# Patient Record
Sex: Female | Born: 1980 | Race: White | Hispanic: No | Marital: Single | Smoking: Never smoker
Health system: Southern US, Community
[De-identification: ages and names within clinical notes are randomized; demographics above are authoritative.]

## PROBLEM LIST (undated history)

## (undated) ENCOUNTER — Emergency Department (HOSPITAL_COMMUNITY): Admission: EM | Source: Home / Self Care

## (undated) DIAGNOSIS — N83209 Unspecified ovarian cyst, unspecified side: Secondary | ICD-10-CM

## (undated) DIAGNOSIS — N200 Calculus of kidney: Secondary | ICD-10-CM

## (undated) DIAGNOSIS — K219 Gastro-esophageal reflux disease without esophagitis: Secondary | ICD-10-CM

## (undated) DIAGNOSIS — F419 Anxiety disorder, unspecified: Secondary | ICD-10-CM

## (undated) DIAGNOSIS — Z9289 Personal history of other medical treatment: Secondary | ICD-10-CM

## (undated) DIAGNOSIS — D649 Anemia, unspecified: Secondary | ICD-10-CM

## (undated) HISTORY — DX: Anxiety disorder, unspecified: F41.9

## (undated) HISTORY — DX: Anemia, unspecified: D64.9

## (undated) HISTORY — DX: Personal history of other medical treatment: Z92.89

## (undated) HISTORY — DX: Gastro-esophageal reflux disease without esophagitis: K21.9

## (undated) HISTORY — DX: Unspecified ovarian cyst, unspecified side: N83.209

---

## 2001-05-13 ENCOUNTER — Emergency Department (HOSPITAL_COMMUNITY): Admission: EM | Admit: 2001-05-13 | Discharge: 2001-05-13 | Payer: Self-pay | Admitting: Emergency Medicine

## 2002-11-19 ENCOUNTER — Ambulatory Visit (HOSPITAL_COMMUNITY): Admission: RE | Admit: 2002-11-19 | Discharge: 2002-11-19 | Payer: Self-pay | Admitting: *Deleted

## 2003-03-30 ENCOUNTER — Inpatient Hospital Stay (HOSPITAL_COMMUNITY): Admission: AD | Admit: 2003-03-30 | Discharge: 2003-04-01 | Payer: Self-pay | Admitting: Obstetrics and Gynecology

## 2003-05-11 ENCOUNTER — Inpatient Hospital Stay (HOSPITAL_COMMUNITY): Admission: AD | Admit: 2003-05-11 | Discharge: 2003-05-11 | Payer: Self-pay | Admitting: Obstetrics and Gynecology

## 2005-08-04 ENCOUNTER — Emergency Department (HOSPITAL_COMMUNITY): Admission: EM | Admit: 2005-08-04 | Discharge: 2005-08-04 | Payer: Self-pay | Admitting: Emergency Medicine

## 2006-11-09 ENCOUNTER — Emergency Department (HOSPITAL_COMMUNITY): Admission: EM | Admit: 2006-11-09 | Discharge: 2006-11-09 | Payer: Self-pay | Admitting: Emergency Medicine

## 2007-06-24 ENCOUNTER — Encounter: Payer: Self-pay | Admitting: Obstetrics & Gynecology

## 2007-06-24 ENCOUNTER — Ambulatory Visit: Payer: Self-pay | Admitting: Obstetrics & Gynecology

## 2007-06-26 ENCOUNTER — Emergency Department (HOSPITAL_COMMUNITY): Admission: EM | Admit: 2007-06-26 | Discharge: 2007-06-26 | Payer: Self-pay | Admitting: Emergency Medicine

## 2007-06-30 ENCOUNTER — Ambulatory Visit (HOSPITAL_COMMUNITY): Admission: RE | Admit: 2007-06-30 | Discharge: 2007-06-30 | Payer: Self-pay | Admitting: Obstetrics and Gynecology

## 2007-07-23 ENCOUNTER — Ambulatory Visit: Payer: Self-pay | Admitting: Family Medicine

## 2007-11-15 ENCOUNTER — Emergency Department (HOSPITAL_COMMUNITY): Admission: EM | Admit: 2007-11-15 | Discharge: 2007-11-15 | Payer: Self-pay | Admitting: Emergency Medicine

## 2007-12-25 ENCOUNTER — Emergency Department (HOSPITAL_BASED_OUTPATIENT_CLINIC_OR_DEPARTMENT_OTHER): Admission: EM | Admit: 2007-12-25 | Discharge: 2007-12-26 | Payer: Self-pay | Admitting: Emergency Medicine

## 2007-12-26 ENCOUNTER — Ambulatory Visit: Payer: Self-pay | Admitting: Diagnostic Radiology

## 2008-08-27 ENCOUNTER — Emergency Department (HOSPITAL_COMMUNITY): Admission: EM | Admit: 2008-08-27 | Discharge: 2008-08-27 | Payer: Self-pay | Admitting: Emergency Medicine

## 2008-10-06 ENCOUNTER — Emergency Department (HOSPITAL_COMMUNITY): Admission: EM | Admit: 2008-10-06 | Discharge: 2008-10-06 | Payer: Self-pay | Admitting: Emergency Medicine

## 2008-10-20 ENCOUNTER — Emergency Department (HOSPITAL_COMMUNITY): Admission: EM | Admit: 2008-10-20 | Discharge: 2008-10-20 | Payer: Self-pay | Admitting: Emergency Medicine

## 2008-10-27 ENCOUNTER — Ambulatory Visit: Payer: Self-pay | Admitting: Obstetrics and Gynecology

## 2008-10-27 ENCOUNTER — Encounter: Payer: Self-pay | Admitting: Obstetrics and Gynecology

## 2008-10-28 ENCOUNTER — Encounter: Payer: Self-pay | Admitting: Obstetrics and Gynecology

## 2008-10-28 LAB — CONVERTED CEMR LAB
HCV Ab: NEGATIVE
Hepatitis B Surface Ag: NEGATIVE
Herpes Simplex Vrs I&II-IgM Ab (EIA): 1.91 — ABNORMAL HIGH
Trich, Wet Prep: NONE SEEN
Yeast Wet Prep HPF POC: NONE SEEN

## 2010-04-27 LAB — URINALYSIS, ROUTINE W REFLEX MICROSCOPIC
Bilirubin Urine: NEGATIVE
Glucose, UA: NEGATIVE mg/dL
Ketones, ur: NEGATIVE mg/dL
Leukocytes, UA: NEGATIVE
Nitrite: NEGATIVE
Protein, ur: NEGATIVE mg/dL
Specific Gravity, Urine: 1.028 (ref 1.005–1.030)
Urobilinogen, UA: 0.2 mg/dL (ref 0.0–1.0)
pH: 5.5 (ref 5.0–8.0)

## 2010-04-27 LAB — URINE MICROSCOPIC-ADD ON

## 2010-04-27 LAB — PREGNANCY, URINE: Preg Test, Ur: NEGATIVE

## 2010-05-22 ENCOUNTER — Emergency Department (HOSPITAL_COMMUNITY)

## 2010-05-22 ENCOUNTER — Emergency Department (HOSPITAL_COMMUNITY)
Admission: EM | Admit: 2010-05-22 | Discharge: 2010-05-22 | Disposition: A | Discharge status: Home / Self Care | Attending: Emergency Medicine | Admitting: Emergency Medicine

## 2010-05-22 DIAGNOSIS — R110 Nausea: Secondary | ICD-10-CM | POA: Insufficient documentation

## 2010-05-22 DIAGNOSIS — Z87442 Personal history of urinary calculi: Secondary | ICD-10-CM | POA: Insufficient documentation

## 2010-05-22 DIAGNOSIS — R109 Unspecified abdominal pain: Secondary | ICD-10-CM | POA: Insufficient documentation

## 2010-05-22 LAB — DIFFERENTIAL
Basophils Absolute: 0 10*3/uL (ref 0.0–0.1)
Basophils Relative: 0 % (ref 0–1)
Eosinophils Absolute: 0.2 10*3/uL (ref 0.0–0.7)
Eosinophils Relative: 3 % (ref 0–5)
Lymphocytes Relative: 26 % (ref 12–46)
Lymphs Abs: 1.8 10*3/uL (ref 0.7–4.0)
Monocytes Absolute: 0.7 10*3/uL (ref 0.1–1.0)
Monocytes Relative: 10 % (ref 3–12)
Neutro Abs: 4.2 10*3/uL (ref 1.7–7.7)
Neutrophils Relative %: 60 % (ref 43–77)

## 2010-05-22 LAB — URINALYSIS, ROUTINE W REFLEX MICROSCOPIC
Bilirubin Urine: NEGATIVE
Glucose, UA: NEGATIVE mg/dL
Hgb urine dipstick: NEGATIVE
Ketones, ur: NEGATIVE mg/dL
Nitrite: NEGATIVE
Protein, ur: NEGATIVE mg/dL
Specific Gravity, Urine: 1.021 (ref 1.005–1.030)
Urobilinogen, UA: 1 mg/dL (ref 0.0–1.0)
pH: 6.5 (ref 5.0–8.0)

## 2010-05-22 LAB — POCT I-STAT, CHEM 8
BUN: 6 mg/dL (ref 6–23)
Calcium, Ion: 1.09 mmol/L — ABNORMAL LOW (ref 1.12–1.32)
Chloride: 103 mEq/L (ref 96–112)
Creatinine, Ser: 0.7 mg/dL (ref 0.4–1.2)
Glucose, Bld: 90 mg/dL (ref 70–99)
HCT: 39 % (ref 36.0–46.0)
Hemoglobin: 13.3 g/dL (ref 12.0–15.0)
Potassium: 3.5 mEq/L (ref 3.5–5.1)
Sodium: 138 mEq/L (ref 135–145)
TCO2: 24 mmol/L (ref 0–100)

## 2010-05-22 LAB — CBC
HCT: 37.8 % (ref 36.0–46.0)
Hemoglobin: 13 g/dL (ref 12.0–15.0)
MCH: 28.8 pg (ref 26.0–34.0)
MCHC: 34.4 g/dL (ref 30.0–36.0)
MCV: 83.6 fL (ref 78.0–100.0)
Platelets: 248 10*3/uL (ref 150–400)
RBC: 4.52 MIL/uL (ref 3.87–5.11)
RDW: 12.8 % (ref 11.5–15.5)
WBC: 6.9 10*3/uL (ref 4.0–10.5)

## 2010-05-22 LAB — POCT PREGNANCY, URINE: Preg Test, Ur: NEGATIVE

## 2010-06-05 NOTE — Group Therapy Note (Signed)
NAMEKOYA, HUNGER NO.:  [id number]   MEDICAL RECORD NO.:  [id number]          PATIENT TYPE:  WOC   LOCATION:  [facility]                   FACILITY:  [facility]   PHYSICIAN:  Tinnie Gens, MD        DATE OF BIRTH:  October 19, 1980   DATE OF SERVICE:  07/23/2007                                  CLINIC NOTE   CHIEF COMPLAINT:  Followup.   HISTORY OF PRESENT ILLNESS:  The patient is a 30 year old gravida 2 para  1-0-1-1 who was seen by Dr. Nicholaus Bloom on June 24, 2007.  She was  complaining of some dyspareunia and infertility.  The patient has a  history of a left ovarian cyst so she ordered a TSH, had a Pap smear and  an ultrasound.  She is here today to follow up these results.  Patient  has a husband who is in [employer], they have been having unprotected  intercourse for the last two years, have not gotten pregnant, but my  guess is that is because he is only home for a day or two at a time.  He  is the father of her other child and she did not have any trouble  getting pregnant with that one.  As I am questioning her though she  breaks into a crying spell today.  I am giving her results that include  a normal pelvic sonogram, normal TSH and normal Pap smear.  She wants to  know why she is gaining so much weight.  She reports that she cries  daily, she is hypersomnolent, that she finds life a little too stressful  her.  She did get to the ER with panic attacks recently, they put her on  Flexeril.  Patient just feels overwhelmed and alone.  She is originally  from [place] but she has no family here.  Her husband is away most of the  time, he has a year left on his commitment to the [employer].  She is trying  to finish school and working all the time.   EXAMINATION:  Her vitals are as noted in the chart.  Her blood pressure  is 140/86 today which is mildly elevated, her weight is 207.   IMPRESSION:  1. Depression and anxiety disorder with questionably panic attacks as       well.  2. Normal pelvic sonogram.  3. Normal thyroid stimulating hormone.   PLAN:  1. Discussed with her at length medicating herself for depression and      anxiety, how long it would take for medication to work and why I      felt like it was necessary.  2. Also reviewed fertility and issues of when was a god time to      achieve pregnancy.  Patient had not really understood this, nor did      she realize just how infrequently she and her husband were actually      having sex for the past two years.  3. She will follow up in two to three months to review this.  ______________________________  Tinnie Gens, MD    TP/MEDQ  D:  07/23/2007  T:  07/23/2007  Job:  [id number]

## 2010-06-05 NOTE — Group Therapy Note (Signed)
NAMEZARRIA, TOWELL NO.:  [id number]   MEDICAL RECORD NO.:  [id number]          PATIENT TYPE:  WOC   LOCATION:  [facility]                   [facility]ACILITY:  [facility]   PHYSICIAN:  Allie Bossier, MD        DATE OF BIRTH:  April 22, 1980   DATE OF SERVICE:                                  CLINIC NOTE   Misty Simpson is a 30 year old married, [place]ian, gravida 2, para 1, and abortus  62 with a 25-year-old son.  She comes in here today for her annual exam.  She complains of some UTI symptoms today.  Urinalysis done today is  normal.  She also complains of dyspareunia since being married to her  husband.  She does give a history of being told that she had an ovarian  cyst seen on a CT scan done in 2008.  She has not had followup for this  since.   PAST MEDICAL HISTORY:  1. Obesity.  2. History of kidney stones in 2008.  3. History of left ovarian cyst in 2008.  4. Dyspareunia since 2008.   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  She  has been married for the last year.  She is wanting a pregnancy and has  been having unprotected intercourse for the last 2 years.  She reports  monthly periods that last for 5 days each.   PREVIOUS PROCEDURE:  She had an elective AB done with Cytotec in the  past.   FAMILY HISTORY:  Negative for breast, GYN, and colon malignancy.  It is  positive for skin cancer in her mother and coronary artery disease/MI in  her father.   SOCIAL HISTORY:  She drinks about 4 alcoholic beverages per week.  Pap  smear was last done in 2006 and that was normal per the patient.   PHYSICAL EXAMINATION:  Weight 207, height 5 feet 5 inch, blood pressure  147/79, and pulse 79.  HEENT:  Normal.  BREAST:  Normal bilaterally.  HEART:  Regular rate and rhythm.  LUNGS:  Clear to auscultation bilaterally.  ABDOMEN:  Benign.  External genitalia, shaved, and no lesions.  Cervix, normal ovulatory  discharge (tenacious clear mucus).  Cervix is without  lesions.  Her  uterus is normal size and shape, midplane, and nontender.  Adnexa are  difficult to palpate due to her body habitus and some voluntary  guarding.   ASSESSMENT/PLAN:  1. Annual exam.  I have checked a Pap smear along with cervical      cultures.  Recommended self-breast exams monthly.  2. Dyspareunia and history of left ovarian cyst.  I will order an      ultrasound to follow up on this.  3. Desires pregnancy.  I have recommended that she begin prenatal      vitamins and folic acid to help prevent birth defects.  I have also      checked a TSH since she has not got pregnant in 2 years.      Allie Bossier, MD     MCD/MEDQ  D:  06/24/2007  T:  06/25/2007  Job:  [id number]
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
## 2010-10-18 LAB — POCT URINALYSIS DIP (DEVICE)
Bilirubin Urine: NEGATIVE
Hgb urine dipstick: NEGATIVE
Ketones, ur: NEGATIVE
Specific Gravity, Urine: 1.015
pH: 7

## 2010-10-22 LAB — COMPREHENSIVE METABOLIC PANEL
ALT: 21
Albumin: 4
Calcium: 9
Glucose, Bld: 99
Sodium: 137
Total Protein: 7

## 2010-10-22 LAB — DIFFERENTIAL
Eosinophils Absolute: 0.2
Lymphs Abs: 1.7
Monocytes Absolute: 0.6
Monocytes Relative: 6
Neutro Abs: 8.2 — ABNORMAL HIGH
Neutrophils Relative %: 77

## 2010-10-22 LAB — URINALYSIS, ROUTINE W REFLEX MICROSCOPIC
Bilirubin Urine: NEGATIVE
Glucose, UA: NEGATIVE
Hgb urine dipstick: NEGATIVE
Nitrite: NEGATIVE
Specific Gravity, Urine: 1.023
pH: 7

## 2010-10-22 LAB — CBC
Hemoglobin: 13.8
MCV: 85.5
RBC: 4.7
WBC: 10.7 — ABNORMAL HIGH

## 2011-06-03 ENCOUNTER — Encounter (HOSPITAL_COMMUNITY): Payer: Self-pay | Admitting: Emergency Medicine

## 2011-06-03 ENCOUNTER — Emergency Department (HOSPITAL_COMMUNITY)
Admission: EM | Admit: 2011-06-03 | Discharge: 2011-06-03 | Disposition: A | Discharge status: Home / Self Care | Payer: Managed Care, Other (non HMO) | Attending: Emergency Medicine | Admitting: Emergency Medicine

## 2011-06-03 ENCOUNTER — Emergency Department (HOSPITAL_COMMUNITY): Payer: Managed Care, Other (non HMO)

## 2011-06-03 DIAGNOSIS — R110 Nausea: Secondary | ICD-10-CM | POA: Insufficient documentation

## 2011-06-03 DIAGNOSIS — N898 Other specified noninflammatory disorders of vagina: Secondary | ICD-10-CM | POA: Insufficient documentation

## 2011-06-03 DIAGNOSIS — R109 Unspecified abdominal pain: Secondary | ICD-10-CM | POA: Insufficient documentation

## 2011-06-03 HISTORY — DX: Calculus of kidney: N20.0

## 2011-06-03 LAB — URINALYSIS, ROUTINE W REFLEX MICROSCOPIC
Glucose, UA: NEGATIVE mg/dL
Ketones, ur: NEGATIVE mg/dL
Nitrite: NEGATIVE
Protein, ur: NEGATIVE mg/dL
Urobilinogen, UA: 0.2 mg/dL (ref 0.0–1.0)

## 2011-06-03 LAB — LIPASE, BLOOD: Lipase: 17 U/L (ref 11–59)

## 2011-06-03 LAB — URINE MICROSCOPIC-ADD ON

## 2011-06-03 LAB — CBC
HCT: 37.2 % (ref 36.0–46.0)
MCH: 29.6 pg (ref 26.0–34.0)
MCV: 84.7 fL (ref 78.0–100.0)
RBC: 4.39 MIL/uL (ref 3.87–5.11)
WBC: 8.7 10*3/uL (ref 4.0–10.5)

## 2011-06-03 LAB — COMPREHENSIVE METABOLIC PANEL
BUN: 6 mg/dL (ref 6–23)
CO2: 25 mEq/L (ref 19–32)
Chloride: 100 mEq/L (ref 96–112)
Creatinine, Ser: 0.65 mg/dL (ref 0.50–1.10)
GFR calc Af Amer: >90 mL/min (ref >90)
GFR calc non Af Amer: >90 mL/min (ref >90)
Total Bilirubin: 0.6 mg/dL (ref 0.3–1.2)

## 2011-06-03 MED ORDER — KETOROLAC TROMETHAMINE 30 MG/ML IJ SOLN
30.0000 mg | Freq: Once | INTRAMUSCULAR | Status: AC
  Administered 2011-06-03: 30 mg via INTRAVENOUS
  Filled 2011-06-03: qty 1

## 2011-06-03 MED ORDER — ONDANSETRON HCL 4 MG/2ML IJ SOLN
4.0000 mg | Freq: Once | INTRAMUSCULAR | Status: AC
  Administered 2011-06-03: 4 mg via INTRAVENOUS
  Filled 2011-06-03: qty 2

## 2011-06-03 MED ORDER — SODIUM CHLORIDE 0.9 % IV BOLUS (SEPSIS)
1000.0000 mL | Freq: Once | INTRAVENOUS | Status: AC
  Administered 2011-06-03: 1000 mL via INTRAVENOUS

## 2011-06-03 MED ORDER — PANTOPRAZOLE SODIUM 40 MG PO TBEC: 40.0000 mg | DELAYED_RELEASE_TABLET | Freq: Every day | ORAL | Status: DC

## 2011-06-03 MED ORDER — ONDANSETRON HCL 8 MG PO TABS: 8.0000 mg | ORAL_TABLET | Freq: Three times a day (TID) | ORAL | Status: AC | PRN

## 2011-06-03 NOTE — ED Provider Notes (Signed)
History     CSN: [id number]  Arrival date & time 06/03/11  4098   First MD Initiated Contact with Patient 06/03/11 [phone]      Chief Complaint  Patient presents with  . Abdominal Pain  . Nausea  . Vaginal Discharge    (Consider location/radiation/quality/duration/timing/severity/associated sxs/prior treatment) The history is provided by the patient.   Pt c/o epigastric/ruq pain after eating jalapenos, etoh and chicken 2 nights ago. ?single episode emesis. Pain crampy, dull comes and goes. No back or flank pain. No gu c/o. No lower abd pain. No fever or chills.  Pt denies ill contacts. No hx pud, pancreatitis, or gallstones. No cough or uri c/o. No chest pain. Is having normal bms, no diarrhea or constipation. lnmp 2 weeks ago - normal.     Past Medical History  Diagnosis Date  . Kidney stones     History reviewed. No pertinent past surgical history.  History reviewed. No pertinent family history.  History  Substance Use Topics  . Smoking status: Never Smoker   . Smokeless tobacco: Not on file  . Alcohol Use: Yes     weekends    OB History    Grav Para Term Preterm Abortions TAB SAB Ect Mult Living                  Review of Systems  Constitutional: Negative for fever and chills.  HENT: Negative for neck pain.   Eyes: Negative for redness.  Respiratory: Negative for cough and shortness of breath.   Cardiovascular: Negative for chest pain.  Gastrointestinal: Positive for abdominal pain.  Genitourinary: Negative for dysuria, hematuria and flank pain.  Musculoskeletal: Negative for back pain.  Skin: Negative for rash.  Neurological: Negative for headaches.  Hematological: Does not bruise/bleed easily.  Psychiatric/Behavioral: Negative for confusion.    Allergies  Review of patient's allergies indicates no known allergies.  Home Medications  No current outpatient prescriptions on file.  BP 138/81  Pulse 65  Temp(Src) 98 F (36.7 C) (Oral)  Resp 16   SpO2 100%  LMP 05/20/2011  Physical Exam  Nursing note and vitals reviewed. Constitutional: She appears well-developed and well-nourished. No distress.  HENT:  Mouth/Throat: Oropharynx is clear and moist.  Eyes: Conjunctivae are normal. No scleral icterus.  Neck: Neck supple. No tracheal deviation present.  Cardiovascular: Normal rate, regular rhythm, normal heart sounds and intact distal pulses.  Exam reveals no gallop and no friction rub.   No murmur heard. Pulmonary/Chest: Effort normal and breath sounds normal. No respiratory distress.  Abdominal: Soft. Normal appearance and bowel sounds are normal. She exhibits no distension and no mass. There is no rebound and no guarding.       Epigastric/upper abd tenderness no rebound or guarding. No hernia.   Genitourinary:       No cva tenderness  Musculoskeletal: She exhibits no edema.  Neurological: She is alert.  Skin: Skin is warm and dry. No rash noted.  Psychiatric: She has a normal mood and affect.    ED Course  Procedures (including critical care time)  Labs Reviewed  COMPREHENSIVE METABOLIC PANEL - Abnormal; Notable for the following:    Glucose, Bld 102 (*)    All other components within normal limits  URINALYSIS, ROUTINE W REFLEX MICROSCOPIC - Abnormal; Notable for the following:    APPearance CLOUDY (*)    Leukocytes, UA SMALL (*)    All other components within normal limits  URINE MICROSCOPIC-ADD ON - Abnormal; Notable for the  following:    Squamous Epithelial / LPF FEW (*)    Bacteria, UA FEW (*)    All other components within normal limits  CBC  LIPASE, BLOOD  PREGNANCY, URINE   US Abdomen Complete  06/03/2011  *RADIOLOGY REPORT*  Clinical Data:  Abdominal pain.  COMPLETE ABDOMINAL ULTRASOUND  Comparison:  CT 05/22/2010.  Findings:  Gallbladder:  No gallstones, gallbladder wall thickening, or pericholecystic fluid.  Common bile duct:   Normal caliber, 5 mm.  Liver:  No focal lesion identified.  Within normal limits  in parenchymal echogenicity.  IVC:  Appears normal.  Pancreas:  No focal abnormality seen.  Spleen:  Within normal limits in size and echotexture.  Right Kidney:   Normal in size and parenchymal echogenicity.  No evidence of mass or hydronephrosis.  Left Kidney:  Normal in size and parenchymal echogenicity.  No evidence of mass or hydronephrosis.  Abdominal aorta:  No aneurysm identified.  IMPRESSION: Negative abdominal ultrasound.  Original Report Authenticated By: Cyndie Chime, M.D.       MDM  Iv ns bolus. zofran iv. Labs.  U/s as epigastric and upper abd pain. \  U/s negative. toradol iv (pt drove self to ed).   Trial po fluids. Recheck abd soft nt.   Tolerating po. abd soft nt.      Suzi Roots, MD 06/03/11 1250

## 2011-06-03 NOTE — ED Notes (Addendum)
Epigastric pain that started at 6 pm yesterday, after eating jalepenos, fried chicken, etoh use saturday. Has had pain like this 2 months ago after greasy and spicy foods and drinking etoh. No vomiting, but nauseated,  Also c/o vag d/c- started yesterday--

## 2011-06-03 NOTE — Discharge Instructions (Signed)
The ultrasound was read as being negative/normal. Rest. Drink plenty of fluids. Take protonix (antacid medication). You may also try maalox as need.  You may take zofran as need for nausea. Follow up with primary care doctor, or here, in next 1-2 days for recheck if symptoms fail to resolve. Return to ER right away if worse, severe pain, persistent vomiting, high fevers, other concern.      Abdominal Pain Abdominal pain can be caused by many things. Your caregiver decides the seriousness of your pain by an examination and possibly blood tests and X-rays. Many cases can be observed and treated at home. Most abdominal pain is not caused by a disease and will probably improve without treatment. However, in many cases, more time must pass before a clear cause of the pain can be found. Before that point, it may not be known if you need more testing, or if hospitalization or surgery is needed. HOME CARE INSTRUCTIONS   Do not take laxatives unless directed by your caregiver.   Take pain medicine only as directed by your caregiver.   Only take over-the-counter or prescription medicines for pain, discomfort, or fever as directed by your caregiver.   Try a clear liquid diet (broth, tea, or water) for as long as directed by your caregiver. Slowly move to a bland diet as tolerated.  SEEK IMMEDIATE MEDICAL CARE IF:   The pain does not go away.   You have a fever.   You keep throwing up (vomiting).   The pain is felt only in portions of the abdomen. Pain in the right side could possibly be appendicitis. In an adult, pain in the left lower portion of the abdomen could be colitis or diverticulitis.   You pass bloody or black tarry stools.  MAKE SURE YOU:   Understand these instructions.   Will watch your condition.   Will get help right away if you are not doing well or get worse.  Document Released: 10/17/2004 Document Revised: 12/27/2010 Document Reviewed: 08/26/2007 [facility] Patient  Information 2012 [place], [place].      Gastritis Gastritis is an inflammation (the body's way of reacting to injury and/or infection) of the stomach. It is often caused by viral or bacterial (germ) infections. It can also be caused by chemicals (including alcohol) and medications. This illness may be associated with generalized malaise (feeling tired, not well), cramps, and fever. The illness may last 2 to 7 days. If symptoms of gastritis continue, gastroscopy (looking into the stomach with a telescope-like instrument), biopsy (taking tissue samples), and/or blood tests may be necessary to determine the cause. Antibiotics will not affect the illness unless there is a bacterial infection present. One common bacterial cause of gastritis is an organism known as H. Pylori. This can be treated with antibiotics. Other forms of gastritis are caused by too much acid in the stomach. They can be treated with medications such as H2 blockers and antacids. Home treatment is usually all that is needed. Young children will quickly become dehydrated (loss of body fluids) if vomiting and diarrhea are both present. Medications may be given to control nausea. Medications are usually not given for diarrhea unless especially bothersome. Some medications slow the removal of the virus from the gastrointestinal tract. This slows down the healing process. HOME CARE INSTRUCTIONS Home care instructions for nausea and vomiting:  For adults: drink small amounts of fluids often. Drink at least 2 quarts a day. Take sips frequently. Do not drink large amounts of fluid at  one time. This may worsen the nausea.   Only take over-the-counter or prescription medicines for pain, discomfort, or fever as directed by your caregiver.   Drink clear liquids only. Those are anything you can see through such as water, broth, or soft drinks.   Once you are keeping clear liquids down, you may start full liquids, soups, juices, and ice cream or  sherbet. Slowly add bland (plain, not spicy) foods to your diet.  Home care instructions for diarrhea:  Diarrhea can be caused by bacterial infections or a virus. Your condition should improve with time, rest, fluids, and/or anti-diarrheal medication.   Until your diarrhea is under control, you should drink clear liquids often in small amounts. Clear liquids include: water, broth, jell-o water and weak tea.  Avoid:  Milk.   Fruits.   Tobacco.   Alcohol.   Extremely hot or cold fluids.   Too much intake of anything at one time.  When your diarrhea stops you may add the following foods, which help the stool to become more formed:  Rice.   Bananas.   Apples without skin.   Dry toast.  Once these foods are tolerated you may add low-fat yogurt and low-fat cottage cheese. They will help to restore the normal bacterial balance in your bowel. Wash your hands well to avoid spreading bacteria (germ) or virus. SEEK IMMEDIATE MEDICAL CARE IF:   You are unable to keep fluids down.   Vomiting or diarrhea become persistent (constant).   Abdominal pain develops, increases, or localizes. (Right sided pain can be appendicitis. Left sided pain in adults can be diverticulitis.)   You develop a fever (an oral temperature above 102 F (38.9 C)).   Diarrhea becomes excessive or contains blood or mucus.   You have excessive weakness, dizziness, fainting or extreme thirst.   You are not improving or you are getting worse.   You have any other questions or concerns.  Document Released: 01/01/2001 Document Revised: 12/27/2010 Document Reviewed: 01/07/2005 [facility] Patient Information 2012 [place], [place].

## 2011-06-03 NOTE — ED Notes (Signed)
Tolerated POs well.

## 2011-06-27 ENCOUNTER — Other Ambulatory Visit: Payer: Self-pay | Admitting: Physician Assistant

## 2011-06-27 ENCOUNTER — Other Ambulatory Visit (HOSPITAL_COMMUNITY)
Admission: RE | Admit: 2011-06-27 | Discharge: 2011-06-27 | Disposition: A | Discharge status: Home / Self Care | Payer: Managed Care, Other (non HMO) | Source: Ambulatory Visit | Attending: Family Medicine | Admitting: Family Medicine

## 2011-06-27 DIAGNOSIS — Z0000 Encounter for general adult medical examination without abnormal findings: Secondary | ICD-10-CM | POA: Insufficient documentation

## 2011-06-27 DIAGNOSIS — Z113 Encounter for screening for infections with a predominantly sexual mode of transmission: Secondary | ICD-10-CM | POA: Insufficient documentation

## 2012-10-20 ENCOUNTER — Ambulatory Visit: Payer: Managed Care, Other (non HMO)

## 2012-10-20 ENCOUNTER — Ambulatory Visit (INDEPENDENT_AMBULATORY_CARE_PROVIDER_SITE_OTHER): Payer: Managed Care, Other (non HMO) | Admitting: Family Medicine

## 2012-10-20 VITALS — BP 120/74 | HR 78 | Temp 97.9°F | Resp 18 | Ht 65.0 in | Wt 201.4 lb

## 2012-10-20 DIAGNOSIS — R1013 Epigastric pain: Secondary | ICD-10-CM

## 2012-10-20 DIAGNOSIS — K5900 Constipation, unspecified: Secondary | ICD-10-CM

## 2012-10-20 DIAGNOSIS — D649 Anemia, unspecified: Secondary | ICD-10-CM

## 2012-10-20 LAB — POCT CBC
HCT, POC: 33.2 % — AB (ref 37.7–47.9)
Lymph, poc: 2 (ref 0.6–3.4)
MCHC: 30.1 g/dL — AB (ref 31.8–35.4)
POC Granulocyte: 5.7 (ref 2–6.9)
POC LYMPH PERCENT: 24.5 %L (ref 10–50)
RDW, POC: 15.5 %

## 2012-10-20 LAB — POCT URINALYSIS DIPSTICK
Blood, UA: NEGATIVE
Ketones, UA: NEGATIVE
Protein, UA: 30
Spec Grav, UA: 1.025
pH, UA: 7

## 2012-10-20 LAB — POCT UA - MICROSCOPIC ONLY: Mucus, UA: POSITIVE

## 2012-10-20 LAB — IFOBT (OCCULT BLOOD): IFOBT: NEGATIVE

## 2012-10-20 MED ORDER — OMEPRAZOLE 40 MG PO CPDR: 40.0000 mg | DELAYED_RELEASE_CAPSULE | Freq: Every day | ORAL | Status: DC

## 2012-10-20 NOTE — Progress Notes (Signed)
Subjective:  Patient has been having acute epigastric pain today, doubling her over. Feels nauseous with no vomiting. She has not been able to move her bowels. She's been constipated recently. Her last menstrual cycle was 3 weeks ago. She generally has fairly healthy.  Objective: Bowel sounds were diminished but present. Abdomen acutely tender to percussion in the epigastrium she has mild distention. Generally he is benign but quite tender across the epigastric region with a little into the left upper and right upper quadrants. Digital rectal exam was negative. Chest clear. Heart regular without murmurs.     Results for orders placed in visit on 10/20/12  POCT CBC      Result Value Range   WBC 8.2  4.6 - 10.2 K/uL   Lymph, poc 2.0  0.6 - 3.4   POC LYMPH PERCENT 24.5  10 - 50 %L   MID (cbc) 0.5  0 - 0.9   POC MID % 6.5  0 - 12 %M   POC Granulocyte 5.7  2 - 6.9   Granulocyte percent 69.0  37 - 80 %G   RBC 4.30  4.04 - 5.48 M/uL   Hemoglobin 10.0 (*) 12.2 - 16.2 g/dL   HCT, POC 40.9 (*) 81.1 - 47.9 %   MCV 77.1 (*) 80 - 97 fL   MCH, POC 23.3 (*) 27 - 31.2 pg   MCHC 30.1 (*) 31.8 - 35.4 g/dL   RDW, POC 91.4     Platelet Count, POC 335  142 - 424 K/uL   MPV 8.3  0 - 99.8 fL  POCT UA - MICROSCOPIC ONLY      Result Value Range   WBC, Ur, HPF, POC tntc     RBC, urine, microscopic 1-3     Bacteria, U Microscopic 3+     Mucus, UA pos     Epithelial cells, urine per micros 2-4     Crystals, Ur, HPF, POC calcium oxalate     Casts, Ur, LPF, POC neg     Yeast, UA neg    POCT URINALYSIS DIPSTICK      Result Value Range   Color, UA yellow     Clarity, UA clear     Glucose, UA neg     Bilirubin, UA neg     Ketones, UA neg     Spec Grav, UA 1.025     Blood, UA neg     pH, UA 7.0     Protein, UA 30     Urobilinogen, UA 0.2     Nitrite, UA neg     Leukocytes, UA small (1+)    POCT URINE PREGNANCY      Result Value Range   Preg Test, Ur Negative    IFOBT (OCCULT BLOOD)      Result  Value Range   IFOBT Negative     [facility] reading (PRIMARY) by  Dr. Alwyn Ren No free air.  No sign of obstruction.  Constipation.  Assessment Constipation with acute abdominal pain Anemia  Plan: Discussed treatment plan as outlined in her discharge instructions. MiraLax, fleets enema tonight, dietary changes Followup on the anemia either here or with her PCP at [facility] physicians. Marland Kitchen

## 2012-10-20 NOTE — Patient Instructions (Signed)
Take MiraLax one dose daily for bowels. May take an extra dose the first day or 2 if necessary. If the bowel start getting too loose decrease to every other day.  Drink plenty of water, about 8 eight-ounce glasses a day  Eat plenty of fruits and vegetables, especially leafy dark green vegetables  Suggest using a fleets enema tonight to start opening up  Take omeprazole 1 daily for stomach  After you are doing better in a week or so begin taking iron one pill twice daily (if it upsets her stomach take only once daily)  Return in 2 months to recheck blood count. Return sooner at any time if pain or other problems. In the event of acutely worsening stomach pain go to the emergency room    Constipation, Adult Constipation is when a person has fewer than 3 bowel movements a week; has difficulty having a bowel movement; or has stools that are dry, hard, or larger than normal. As people grow older, constipation is more common. If you try to fix constipation with medicines that make you have a bowel movement (laxatives), the problem may get worse. Long-term laxative use may cause the muscles of the colon to become weak. A low-fiber diet, not taking in enough fluids, and taking certain medicines may make constipation worse. CAUSES   Certain medicines, such as antidepressants, pain medicine, iron supplements, antacids, and water pills.   Certain diseases, such as diabetes, irritable bowel syndrome (IBS), thyroid disease, or depression.   Not drinking enough water.   Not eating enough fiber-rich foods.   Stress or travel.  Lack of physical activity or exercise.  Not going to the restroom when there is the urge to have a bowel movement.  Ignoring the urge to have a bowel movement.  Using laxatives too much. SYMPTOMS   Having fewer than 3 bowel movements a week.   Straining to have a bowel movement.   Having hard, dry, or larger than normal stools.   Feeling full or bloated.    Pain in the lower abdomen.  Not feeling relief after having a bowel movement. DIAGNOSIS  Your caregiver will take a medical history and perform a physical exam. Further testing may be done for severe constipation. Some tests may include:   A barium enema X-ray to examine your rectum, colon, and sometimes, your small intestine.  A sigmoidoscopy to examine your lower colon.  A colonoscopy to examine your entire colon. TREATMENT  Treatment will depend on the severity of your constipation and what is causing it. Some dietary treatments include drinking more fluids and eating more fiber-rich foods. Lifestyle treatments may include regular exercise. If these diet and lifestyle recommendations do not help, your caregiver may recommend taking over-the-counter laxative medicines to help you have bowel movements. Prescription medicines may be prescribed if over-the-counter medicines do not work.  HOME CARE INSTRUCTIONS   Increase dietary fiber in your diet, such as fruits, vegetables, whole grains, and beans. Limit high-fat and processed sugars in your diet, such as [place] fries, hamburgers, cookies, candies, and soda.   A fiber supplement may be added to your diet if you cannot get enough fiber from foods.   Drink enough fluids to keep your urine clear or pale yellow.   Exercise regularly or as directed by your caregiver.   Go to the restroom when you have the urge to go. Do not hold it.  Only take medicines as directed by your caregiver. Do not take other medicines for constipation  without talking to your caregiver first. SEEK IMMEDIATE MEDICAL CARE IF:   You have bright red blood in your stool.   Your constipation lasts for more than 4 days or gets worse.   You have abdominal or rectal pain.   You have thin, pencil-like stools.  You have unexplained weight loss. MAKE SURE YOU:   Understand these instructions.  Will watch your condition.  Will get help right away if you  are not doing well or get worse. Document Released: 10/06/2003 Document Revised: 04/01/2011 Document Reviewed: 12/11/2010 [facility] Main Patient Information 2014 [place], [place].

## 2012-10-21 LAB — COMPREHENSIVE METABOLIC PANEL
Alkaline Phosphatase: 76 U/L (ref 39–117)
BUN: 9 mg/dL (ref 6–23)
Glucose, Bld: 94 mg/dL (ref 70–99)
Sodium: 136 mEq/L (ref 135–145)
Total Bilirubin: 0.5 mg/dL (ref 0.3–1.2)
Total Protein: 7.4 g/dL (ref 6.0–8.3)

## 2012-10-22 ENCOUNTER — Encounter: Payer: Self-pay | Admitting: *Deleted

## 2018-01-23 ENCOUNTER — Ambulatory Visit (INDEPENDENT_AMBULATORY_CARE_PROVIDER_SITE_OTHER): Payer: Self-pay | Admitting: Obstetrics & Gynecology

## 2018-01-23 ENCOUNTER — Encounter: Payer: Self-pay | Admitting: Obstetrics & Gynecology

## 2018-01-23 ENCOUNTER — Other Ambulatory Visit: Payer: Self-pay | Admitting: Obstetrics & Gynecology

## 2018-01-23 VITALS — BP 153/100 | HR 108 | Ht 66.0 in | Wt 187.8 lb

## 2018-01-23 DIAGNOSIS — N920 Excessive and frequent menstruation with regular cycle: Secondary | ICD-10-CM

## 2018-01-23 DIAGNOSIS — D649 Anemia, unspecified: Secondary | ICD-10-CM

## 2018-01-23 DIAGNOSIS — N644 Mastodynia: Secondary | ICD-10-CM

## 2018-01-23 MED ORDER — IBUPROFEN 600 MG PO TABS: 600.0000 mg | ORAL_TABLET | Freq: Three times a day (TID) | ORAL | 4 refills | Status: DC

## 2018-01-23 NOTE — Progress Notes (Signed)
Diagnostic Mammogram and breast ultrasound scheduled for January 8th @ 1240.  Pt instructed to arrive at 1220 at [place] [facility] with her photo ID and insurance cards.  Pt instructed not to wear perfumes, powders, or deodorants.  Pt verbalized understanding.

## 2018-01-23 NOTE — Progress Notes (Addendum)
Patient ID: Misty Simpson, female   DOB: [birth year], 38 y.o.   MRN: [id number]  Chief Complaint  Patient presents with  . Breast Pain    HPI Misty Simpson is a 38 y.o. single G67(14 yo son) here with a week's history of bilateral breast pain. This comes and goes, "sharp and uncomfortable". She has tried tried to take her mind off the pain with cleaning and going to the gym. She has not tried any tylenol or IBU. Nothing makes it better or worse. She uses doesn't use any contraception.   Upon review of systems, she has a h/o anemia and always has heavy periods with clots. Periods last 5-7 days.   Past Medical History:  Diagnosis Date  . Kidney stones     History reviewed. No pertinent surgical history.  History reviewed. No pertinent family history.  Social History Social History   Tobacco Use  . Smoking status: Never Smoker  . Smokeless tobacco: Never Used  Substance Use Topics  . Alcohol use: Yes    Comment: weekends  . Drug use: No    No Known Allergies  Current Outpatient Medications  Medication Sig Dispense Refill  . Multiple Vitamins-Minerals (MULTIVITAMIN PO) Take by mouth.     No current facility-administered medications for this visit.     Review of Systems Review of Systems Monogamous for 3 years, lives together Flu vaccine, pap smear, tdap UTD She only drinks 1-2 caffeinated beverages daily   Blood pressure (!) 153/100, pulse (!) 108, height 5\' 6"  (1.676 m), weight 187 lb 12.8 oz (85.2 kg), last menstrual period 01/22/2018.  Physical Exam Physical Exam Breathing, conversing, and ambulating normally Well nourished, well hydrated White female, no apparent distress Breast- no lymph nodes, no masses or skin changes or nipple discharge, very tender to touch bilateral Abd- benign Pelvic-  patient declines this since she is having her period today Data Reviewed anemic  Assessment    Anemia with heavy periods Breast pain bilateral     Plan    Get gyn u/s and check CBC Schedule mammogram and breast u/s  Come back 1 week rec RTC q8 hours IBU 600 mg     Afsana Liera C Deshundra Waller 01/23/2018, 10:47 AM

## 2018-01-24 LAB — CBC
HEMOGLOBIN: 6.1 g/dL — AB (ref 11.1–15.9)
Hematocrit: 22.9 % — ABNORMAL LOW (ref 34.0–46.6)
MCH: 16.2 pg — AB (ref 26.6–33.0)
MCHC: 26.6 g/dL — AB (ref 31.5–35.7)
MCV: 61 fL — ABNORMAL LOW (ref 79–97)
PLATELETS: 410 10*3/uL (ref 150–450)
RBC: 3.77 x10E6/uL (ref 3.77–5.28)
RDW: 19 % — AB (ref 12.3–15.4)
WBC: 6 10*3/uL (ref 3.4–10.8)

## 2018-01-26 ENCOUNTER — Telehealth: Payer: Self-pay | Admitting: *Deleted

## 2018-01-26 ENCOUNTER — Other Ambulatory Visit: Payer: Self-pay | Admitting: Obstetrics & Gynecology

## 2018-01-26 ENCOUNTER — Other Ambulatory Visit: Payer: Self-pay

## 2018-01-26 ENCOUNTER — Observation Stay (HOSPITAL_COMMUNITY)
Admission: AD | Admit: 2018-01-26 | Discharge: 2018-01-27 | Disposition: A | Discharge status: Home / Self Care | Payer: BLUE CROSS/BLUE SHIELD | Attending: Obstetrics and Gynecology | Admitting: Obstetrics and Gynecology

## 2018-01-26 DIAGNOSIS — N644 Mastodynia: Secondary | ICD-10-CM

## 2018-01-26 DIAGNOSIS — Z79899 Other long term (current) drug therapy: Secondary | ICD-10-CM | POA: Diagnosis not present

## 2018-01-26 DIAGNOSIS — D649 Anemia, unspecified: Secondary | ICD-10-CM | POA: Diagnosis not present

## 2018-01-26 DIAGNOSIS — N939 Abnormal uterine and vaginal bleeding, unspecified: Secondary | ICD-10-CM | POA: Diagnosis present

## 2018-01-26 LAB — HEMOGLOBIN AND HEMATOCRIT, BLOOD
HCT: 25.5 % — ABNORMAL LOW (ref 36.0–46.0)
Hemoglobin: 6.7 g/dL — CL (ref 12.0–15.0)

## 2018-01-26 LAB — PREPARE RBC (CROSSMATCH)

## 2018-01-26 LAB — ABO/RH: ABO/RH(D): O POS

## 2018-01-26 MED ORDER — ACETAMINOPHEN 325 MG PO TABS
650.0000 mg | ORAL_TABLET | Freq: Once | ORAL | Status: AC
  Administered 2018-01-26: 650 mg via ORAL
  Filled 2018-01-26: qty 2

## 2018-01-26 MED ORDER — SODIUM CHLORIDE 0.9% IV SOLUTION
Freq: Once | INTRAVENOUS | Status: AC
  Administered 2018-01-26: 20:00:00 via INTRAVENOUS

## 2018-01-26 NOTE — Telephone Encounter (Signed)
Was able to get a hold of pt and she came to speak with Dr. Hulan Fray in the clinic.

## 2018-01-26 NOTE — H&P (Signed)
OB/GYN History and Physical  Krina Belita Warsame is a 38 y.o. G1P1001 presenting for IV blood transfusion for symptomatic anemia.       Past Medical History:  Diagnosis Date  . Kidney stones     No past surgical history on file.  OB History  Gravida Para Term Preterm AB Living  1 1 1     1   SAB TAB Ectopic Multiple Live Births          1    # Outcome Date GA Lbr Len/2nd Weight Sex Delivery Anes PTL Lv  1 Term 03/30/03    M Vag-Spont   LIV    Social History   Socioeconomic History  . Marital status: Single    Spouse name: Not on file  . Number of children: Not on file  . Years of education: Not on file  . Highest education level: Not on file  Occupational History  . Not on file  Social Needs  . Financial resource strain: Not on file  . Food insecurity:    Worry: Not on file    Inability: Not on file  . Transportation needs:    Medical: Not on file    Non-medical: Not on file  Tobacco Use  . Smoking status: Never Smoker  . Smokeless tobacco: Never Used  Substance and Sexual Activity  . Alcohol use: Yes    Comment: weekends  . Drug use: No  . Sexual activity: Yes    Birth control/protection: Condom  Lifestyle  . Physical activity:    Days per week: Not on file    Minutes per session: Not on file  . Stress: Not on file  Relationships  . Social connections:    Talks on phone: Not on file    Gets together: Not on file    Attends religious service: Not on file    Active member of club or organization: Not on file    Attends meetings of clubs or organizations: Not on file    Relationship status: Not on file  Other Topics Concern  . Not on file  Social History Narrative  . Not on file    No family history on file.  Medications Prior to Admission  Medication Sig Dispense Refill Last Dose  . ibuprofen (ADVIL,MOTRIN) 600 MG tablet Take 1 tablet (600 mg total) by mouth 3 (three) times daily. 90 tablet 4   . Multiple Vitamins-Minerals (MULTIVITAMIN PO)  Take by mouth.   Taking    No Known Allergies  Review of Systems: Negative except for what is mentioned in HPI.     Physical Exam: LMP 01/22/2018 (Exact Date)  CONSTITUTIONAL: Well-developed, well-nourished female in no acute distress. Mildly tearful with interview. HENT:  Normocephalic, atraumatic, External right and left ear normal. Oropharynx is clear and moist EYES: Conjunctivae and EOM are normal. Pupils are equal, round, and reactive to light. No scleral icterus.  NECK: Normal range of motion, supple, no masses SKIN: Skin is warm and dry. No rash noted. Not diaphoretic. No erythema. No pallor. [place]: Alert and oriented to person, place, and time. Normal reflexes, muscle tone coordination. No cranial nerve deficit noted. PSYCHIATRIC: Normal mood and affect. Normal behavior. Normal judgment and thought content. CARDIOVASCULAR: Normal heart rate noted, regular rhythm RESPIRATORY: Effort normal, no problems with respiration noted ABDOMEN: nondistended PELVIC: Deferred MUSCULOSKELETAL: Normal range of motion. No edema and no tenderness. 2+ distal pulses.   Pertinent Labs/Studies:   No results found for this or any previous visit (  from the past 72 hour(s)).     Assessment and Plan :Norma Montemurro is a 39 y.o. G1P1001 admitted for transfusion for symptomatic anemia. Reviewed risks/benefits, she is agreeable. 3 units pRBCs ordered.    Plan for transfusion 3 units pRBCs T&S Regular diet Activity as tolerated   Likely discharge home after transfusion and she will f/u in office for AUB workup   K. Arvilla Meres, M.D. Attending [place], [facility] for [employer], [place]

## 2018-01-26 NOTE — Progress Notes (Signed)
She has a hbg of 6 and is symptomatic. I offered her iron infusions versus transfusion of PRBC x 3. She opts for PRBC.

## 2018-01-26 NOTE — Telephone Encounter (Signed)
Called pt per Dr. Alease Medina request to ask that the pt come to the clinic to discuss lab results.  Pt did not pick up and left message requesting pt call the clinic.  Attempted to contact pt via home phone but pt did not pick up and no voicemail was set up.  Per Dr. Hulan Fray, Pt to be contacted regarding CBC and needs to speak with Dr. Hulan Fray

## 2018-01-27 DIAGNOSIS — N939 Abnormal uterine and vaginal bleeding, unspecified: Secondary | ICD-10-CM | POA: Diagnosis present

## 2018-01-27 DIAGNOSIS — D649 Anemia, unspecified: Secondary | ICD-10-CM | POA: Diagnosis not present

## 2018-01-28 ENCOUNTER — Ambulatory Visit: Payer: Self-pay

## 2018-01-28 ENCOUNTER — Ambulatory Visit
Admission: RE | Admit: 2018-01-28 | Discharge: 2018-01-28 | Disposition: A | Discharge status: Home / Self Care | Payer: BLUE CROSS/BLUE SHIELD | Source: Ambulatory Visit | Attending: Obstetrics & Gynecology | Admitting: Obstetrics & Gynecology

## 2018-01-28 DIAGNOSIS — N644 Mastodynia: Secondary | ICD-10-CM

## 2018-01-28 LAB — BPAM RBC
BLOOD PRODUCT EXPIRATION DATE: 202001242359
Blood Product Expiration Date: 202001162359
Blood Product Expiration Date: 202001162359
ISSUE DATE / TIME: 202001062018
ISSUE DATE / TIME: 202001062227
ISSUE DATE / TIME: 202001070018
Unit Type and Rh: 5100
Unit Type and Rh: 5100
Unit Type and Rh: 5100

## 2018-01-28 LAB — TYPE AND SCREEN
ABO/RH(D): O POS
ANTIBODY SCREEN: NEGATIVE
Unit division: 0
Unit division: 0
Unit division: 0

## 2018-01-28 NOTE — Discharge Summary (Signed)
   Physician Discharge Summary  Patient ID: Misty Simpson MRN: [id number] DOB/AGE: Aug 20, 1980 38 y.o.  Admit date: 01/26/2018 Discharge date: 01/28/2018  Admission Diagnoses: symptomatic anemia  Discharge Diagnoses:  Principal Problem:   Symptomatic anemia Active Problems:   Abnormal uterine bleeding (AUB)   Discharged Condition: good  Hospital Course: Please see HPI dated 01/26/2018 for full details. Briefly, this is a 38 y.o. G46P1001 female who presented for blood transfusion for symptomatic anemia. She received 3 units pRBCs and was discharged home. No complications.   Physical Exam:  BP 122/63   Pulse 66   Temp 97.9 F (36.6 C) (Oral)   Resp 18   LMP 01/22/2018 (Exact Date)   SpO2 100%  General: alert, oriented, cooperative  Labs: Lab Results  Component Value Date   WBC 6.0 01/23/2018   HGB 6.7 (LL) 01/26/2018   HCT 25.5 (L) 01/26/2018   MCV 61 (L) 01/23/2018   PLT 410 01/23/2018   CMP Latest Ref Rng & Units 10/20/2012  Glucose 70 - 99 mg/dL 94  BUN 6 - 23 mg/dL 9  Creatinine 0.50 - 1.10 mg/dL 0.74  Sodium 135 - 145 mEq/L 136  Potassium 3.5 - 5.3 mEq/L 4.1  Chloride 96 - 112 mEq/L 106  CO2 19 - 32 mEq/L 23  Calcium 8.4 - 10.5 mg/dL 8.9  Total Protein 6.0 - 8.3 g/dL 7.4  Total Bilirubin 0.3 - 1.2 mg/dL 0.5  Alkaline Phos 39 - 117 U/L 76  AST 0 - 37 U/L 15  ALT 0 - 35 U/L 18      Disposition:   Discharge Instructions    Call MD for:  difficulty breathing, headache or visual disturbances   Complete by:  As directed    Call MD for:  extreme fatigue   Complete by:  As directed    Call MD for:  hives   Complete by:  As directed    Call MD for:  persistant dizziness or light-headedness   Complete by:  As directed    Call MD for:  persistant nausea and vomiting   Complete by:  As directed    Call MD for:  redness, tenderness, or signs of infection (pain, swelling, redness, odor or green/yellow discharge around incision site)   Complete by:  As  directed    Call MD for:  severe uncontrolled pain   Complete by:  As directed    Call MD for:  temperature >100.4   Complete by:  As directed    Diet - low sodium heart healthy   Complete by:  As directed    Increase activity slowly   Complete by:  As directed      An After Visit Summary was printed and given to the patient. Allergies as of 01/27/2018   No Known Allergies     Medication List    STOP taking these medications   ibuprofen 600 MG tablet Commonly known as:  ADVIL,MOTRIN   MULTIVITAMIN PO      Follow-up Information    Emily Filbert, MD. Go to.   Specialty:  Obstetrics and Gynecology Contact information: [place] [address] [phone]           Signed: Sloan Leiter 01/28/2018, 3:26 PM

## 2018-02-05 ENCOUNTER — Ambulatory Visit (INDEPENDENT_AMBULATORY_CARE_PROVIDER_SITE_OTHER): Payer: BLUE CROSS/BLUE SHIELD | Admitting: Obstetrics & Gynecology

## 2018-02-05 ENCOUNTER — Encounter: Payer: Self-pay | Admitting: Obstetrics & Gynecology

## 2018-02-05 VITALS — BP 142/77 | Ht 66.0 in | Wt 188.8 lb

## 2018-02-05 DIAGNOSIS — D649 Anemia, unspecified: Secondary | ICD-10-CM | POA: Diagnosis not present

## 2018-02-05 NOTE — Progress Notes (Signed)
Ultrasound scheduled for Thursday 02/12/2018 @ 0830.  Pt instructed to arrive at 0815 at [facility].

## 2018-02-05 NOTE — Progress Notes (Signed)
   Subjective:    Patient ID: Misty Simpson, female    DOB: Oct 15, 1980, 38 y.o.   MRN: [id number]  HPI  38 yo P1 here for follow up after having a transfusion of 3 units of PRBC about a week ago. She feels much better, no more heart palpitations, no more SOB, no more mastalgia. She is having no problems today  Review of Systems     Objective:   Physical Exam Breathing, conversing, and ambulating normally Well nourished, well hydrated White female, no apparent distress Abd- benign     Assessment & Plan:  H/o symptomatic anemia Check CBC rec take iron and magnesium daily Menorrhagia- check gyn u/s Come back 3 weeks

## 2018-02-06 LAB — CBC
HEMATOCRIT: 33.4 % — AB (ref 34.0–46.6)
Hemoglobin: 9.9 g/dL — ABNORMAL LOW (ref 11.1–15.9)
MCH: 20.2 pg — ABNORMAL LOW (ref 26.6–33.0)
MCHC: 29.6 g/dL — ABNORMAL LOW (ref 31.5–35.7)
MCV: 68 fL — ABNORMAL LOW (ref 79–97)
Platelets: 340 10*3/uL (ref 150–450)
RBC: 4.89 x10E6/uL (ref 3.77–5.28)
RDW: 25.1 % — ABNORMAL HIGH (ref 11.7–15.4)
WBC: 5.8 10*3/uL (ref 3.4–10.8)

## 2018-02-12 ENCOUNTER — Ambulatory Visit (HOSPITAL_COMMUNITY)
Admission: RE | Admit: 2018-02-12 | Discharge: 2018-02-12 | Disposition: A | Discharge status: Home / Self Care | Payer: BLUE CROSS/BLUE SHIELD | Source: Ambulatory Visit | Attending: Obstetrics & Gynecology | Admitting: Obstetrics & Gynecology

## 2018-02-12 DIAGNOSIS — N920 Excessive and frequent menstruation with regular cycle: Secondary | ICD-10-CM | POA: Diagnosis present

## 2018-08-13 ENCOUNTER — Ambulatory Visit: Payer: BLUE CROSS/BLUE SHIELD | Admitting: Obstetrics & Gynecology

## 2019-01-12 ENCOUNTER — Ambulatory Visit: Payer: BC Managed Care – PPO | Admitting: Obstetrics & Gynecology

## 2019-01-25 ENCOUNTER — Telehealth: Payer: BC Managed Care – PPO | Admitting: Family

## 2019-01-25 DIAGNOSIS — Z20822 Contact with and (suspected) exposure to covid-19: Secondary | ICD-10-CM

## 2019-01-25 MED ORDER — BENZONATATE 100 MG PO CAPS: 100.0000 mg | ORAL_CAPSULE | Freq: Three times a day (TID) | ORAL | 0 refills | Status: DC | PRN

## 2019-01-25 NOTE — Progress Notes (Signed)
E-Visit for Corona Virus Screening   Your current symptoms could be consistent with the coronavirus.  Many health care providers can now test patients at their office but not all are.  [place] has multiple testing sites. For information on our [place] testing locations and hours go to [email or web address]  We are enrolling you in our [place] for [place] . Daily you will receive a questionnaire within the [place] website. Our COVID 19 response team will be monitoring your responses daily.  Testing Information: The COVID-19 Community Testing sites will begin testing BY APPOINTMENT ONLY.  You can schedule online at [email or web address]  If you do not have access to a smart phone or computer you may call [phone] for an appointment.  Testing Locations: Appointment schedule is 8 am to 3:30 pm at all sites  [facility] indoors at [address], [place] [address] [employer]  indoors at [place]. [address], [place], [place] [address] [place] indoors at [address], [place] [address]  Additional testing sites in the Community:  . For [facility] Testing sites in [facility]  FaceUpdate.uy  . For Pop-up testing sites in [place]  BowlDirectory.co.uk  . For Testing sites with regular hours https://onsms.org/[place]/  . For [address] [place] RenewablesAnalytics.si  . For [facility] BasicJet.[email or web address]  . For [facility] testing in [place] and [employer] BasicJet.[email or web address]  . For [facility] testing in [facility]   https://lhi.care/covidtesting  For  more  information about community testing call 2675980208   We are enrolling you in our [place] for [place] . Daily you will receive a questionnaire within the [place] website. Our COVID 19 response team will be monitoring your responses daily.  Please quarantine yourself while awaiting your test results. If you develop fever/cough/breathlessness, please stay home for 10 days with improving symptoms and until you have had 24 hours of no fever (without taking a fever reducer).  You should wear a mask or cloth face covering over your nose and mouth if you must be around other people or animals, including pets (even at home). Try to stay at least 6 feet away from other people. This will protect the people around you.  Please continue good preventive care measures, including:  frequent hand-washing, avoid touching your face, cover coughs/sneezes, stay out of crowds and keep a 6 foot distance from others.  COVID-19 is a respiratory illness with symptoms that are similar to the flu. Symptoms are typically mild to moderate, but there have been cases of severe illness and death due to the virus.   The following symptoms may appear 2-14 days after exposure: . Fever . Cough . Shortness of breath or difficulty breathing . Chills . Repeated shaking with chills . Muscle pain . Headache . Sore throat . New loss of taste or smell . Fatigue . Congestion or runny nose . Nausea or vomiting . Diarrhea  Go to the nearest hospital ED for assessment if fever/cough/breathlessness are severe or illness seems like a threat to life.  It is vitally important that if you feel that you have an infection such as this virus or any other virus that you stay home and away from places where you may spread it to others.  You should avoid contact with people age 46 and older.   You can use medication such as A prescription cough medication called Tessalon Perles 100 mg. You may take 1-2 capsules every 8 hours as  needed for cough.  You may also take acetaminophen (Tylenol) as needed for fever.  Reduce your risk of any infection by using the same precautions used for avoiding the common cold or flu:  [place] Kitchen Wash your hands often with soap and warm water for at least 20 seconds.  If soap and water are not readily available, use an alcohol-based hand sanitizer with at least 60% alcohol.  . If coughing or sneezing, cover your mouth and nose by coughing or sneezing into the elbow areas of your shirt or coat, into a tissue or into your sleeve (not your hands). . Avoid shaking hands with others and consider head nods or verbal greetings only. . Avoid touching your eyes, nose, or mouth with unwashed hands.  . Avoid close contact with people who are sick. . Avoid places or events with large numbers of people in one location, like concerts or sporting events. . Carefully consider travel plans you have or are making. . If you are planning any travel outside or inside the [place], visit the CDC's Travelers' Health webpage for the latest health notices. . If you have some symptoms but not all symptoms, continue to monitor at home and seek medical attention if your symptoms worsen. . If you are having a medical emergency, call 911.  HOME CARE . Only take medications as instructed by your medical team. . Drink plenty of fluids and get plenty of rest. . A steam or ultrasonic humidifier can help if you have congestion.   GET HELP RIGHT AWAY IF YOU HAVE EMERGENCY WARNING SIGNS** FOR COVID-19. If you or someone is showing any of these signs seek emergency medical care immediately. Call 911 or proceed to your closest emergency facility if: . You develop worsening high fever. . Trouble breathing . Bluish lips or face . Persistent pain or pressure in the chest . New confusion . Inability to wake or stay awake . You cough up blood. . Your symptoms become more severe  **This list is not all possible symptoms. Contact your  medical provider for any symptoms that are sever or concerning to you.  MAKE SURE YOU   Understand these instructions.  Will watch your condition.  Will get help right away if you are not doing well or get worse.  Your e-visit answers were reviewed by a board certified advanced clinical practitioner to complete your personal care plan.  Depending on the condition, your plan could have included both over the counter or prescription medications.  If there is a problem please reply once you have received a response from your provider.  Your safety is important to [place].  If you have drug allergies check your prescription carefully.    You can use MyChart to ask questions about today's visit, request a non-urgent call back, or ask for a work or school excuse for 24 hours related to this e-Visit. If it has been greater than 24 hours you will need to follow up with your provider, or enter a new e-Visit to address those concerns. You will get an e-mail in the next two days asking about your experience.  I hope that your e-visit has been valuable and will speed your recovery. Thank you for using e-visits.  Approximately 5 minutes was spent documenting and reviewing patient's chart.

## 2019-02-01 ENCOUNTER — Ambulatory Visit: Payer: BC Managed Care – PPO | Admitting: Family Medicine

## 2019-02-02 ENCOUNTER — Ambulatory Visit: Payer: BC Managed Care – PPO | Admitting: Nurse Practitioner

## 2019-02-02 ENCOUNTER — Encounter: Payer: Self-pay | Admitting: Emergency Medicine

## 2019-02-02 ENCOUNTER — Ambulatory Visit
Admission: EM | Admit: 2019-02-02 | Discharge: 2019-02-02 | Disposition: A | Discharge status: Home / Self Care | Payer: BC Managed Care – PPO | Attending: Emergency Medicine | Admitting: Emergency Medicine

## 2019-02-02 ENCOUNTER — Other Ambulatory Visit: Payer: Self-pay

## 2019-02-02 ENCOUNTER — Ambulatory Visit
Admission: RE | Admit: 2019-02-02 | Discharge: 2019-02-02 | Disposition: A | Discharge status: Other Acute Inpatient Hospital | Payer: BC Managed Care – PPO | Source: Ambulatory Visit

## 2019-02-02 DIAGNOSIS — U071 COVID-19: Secondary | ICD-10-CM

## 2019-02-02 DIAGNOSIS — Z20822 Contact with and (suspected) exposure to covid-19: Secondary | ICD-10-CM

## 2019-02-02 MED ORDER — AEROCHAMBER PLUS FLO-VU MEDIUM MISC: 1.0000 | Freq: Once | 0 refills | Status: AC

## 2019-02-02 MED ORDER — BENZONATATE 100 MG PO CAPS: 100.0000 mg | ORAL_CAPSULE | Freq: Three times a day (TID) | ORAL | 0 refills | Status: DC | PRN

## 2019-02-02 MED ORDER — ALBUTEROL SULFATE HFA 108 (90 BASE) MCG/ACT IN AERS: 2.0000 | INHALATION_SPRAY | RESPIRATORY_TRACT | 0 refills | Status: DC | PRN

## 2019-02-02 NOTE — ED Provider Notes (Signed)
Patient would prefer to be seen in person and will come to the Urgent Care for evaluation of her symptoms.     Sharion Balloon, NP 02/02/19 1312

## 2019-02-02 NOTE — Discharge Instructions (Signed)
User spacing device with inhaler. Take cough medication as needed. Very important to follow up with family doctor. Go to ER for worsening breathing (especially if inhaler does not help) chest pain, leg swelling.

## 2019-02-02 NOTE — ED Provider Notes (Signed)
[facility]    CSN: [id number] Arrival date & time: 02/02/19  1348      History   Chief Complaint Chief Complaint  Patient presents with  . COVID POSITIVE    HPI Misty Simpson is a 39 y.o. female with history of kidney stones, Covid infection (tested positive 01/27/2019) presenting for persistent coughing, chest pain, shortness of breath.  States symptoms are worse at night which caused her to feel tired the next day.  Overall patient feels fine throughout the day: Denies chest pain and shortness of breath in office.  Denies cardiac history, lower extremity edema, DOE.  Has been able to perform all ADLs at baseline.  Has not tried anything for symptoms, never picked up benzonatate which was prescribed to her 1/4 when evaluated initially for possible Covid by her PCP.  Past Medical History:  Diagnosis Date  . Kidney stones     Patient Active Problem List   Diagnosis Date Noted  . Abnormal uterine bleeding (AUB) 01/27/2018  . Symptomatic anemia 01/26/2018  . Mastalgia 01/23/2018    History reviewed. No pertinent surgical history.  OB History    Gravida  1   Para  1   Term  1   Preterm      AB      Living  1     SAB      TAB      Ectopic      Multiple      Live Births  1            Home Medications    Prior to Admission medications   Medication Sig Start Date End Date Taking? Authorizing Provider  albuterol (VENTOLIN HFA) 108 (90 Base) MCG/ACT inhaler Inhale 2 puffs into the lungs every 4 (four) hours as needed for wheezing or shortness of breath. 02/02/19   Hall-Potvin, [place], PA-C  benzonatate (TESSALON PERLES) 100 MG capsule Take 1 capsule (100 mg total) by mouth 3 (three) times daily as needed. 02/02/19   Hall-Potvin, [place], PA-C  Spacer/Aero-Holding Chambers (AEROCHAMBER PLUS FLO-VU MEDIUM) MISC 1 each by Other route once for 1 dose. 02/02/19 02/02/19  Hall-Potvin, [place], PA-C    Family History History reviewed. No  pertinent family history.  Social History Social History   Tobacco Use  . Smoking status: Never Smoker  . Smokeless tobacco: Never Used  Substance Use Topics  . Alcohol use: Yes    Comment: weekends  . Drug use: No     Allergies   Patient has no known allergies.   Review of Systems As per HPI   Physical Exam Triage Vital Signs ED Triage Vitals [02/02/19 1416]  Enc Vitals Group     BP 139/83     Pulse Rate 66     Resp 18     Temp 98.1 F (36.7 C)     Temp Source Temporal     SpO2 98 %     Weight      Height      Head Circumference      Peak Flow      Pain Score 0     Pain Loc      Pain Edu?      Excl. in [place]?    No data found.  Updated Vital Signs BP 139/83 (BP Location: Left Arm)   Pulse 66   Temp 98.1 F (36.7 C) (Temporal) Comment: Thheraflu on board  Resp 18   LMP 01/24/2019   SpO2  98%   Visual Acuity Right Eye Distance:   Left Eye Distance:   Bilateral Distance:    Right Eye Near:   Left Eye Near:    Bilateral Near:     Physical Exam Constitutional:      General: She is not in acute distress.    Appearance: She is obese. She is not ill-appearing or diaphoretic.  HENT:     Head: Normocephalic and atraumatic.     Mouth/Throat:     Mouth: Mucous membranes are moist.     Pharynx: Oropharynx is clear. No oropharyngeal exudate or posterior oropharyngeal erythema.  Eyes:     General: No scleral icterus.    Conjunctiva/sclera: Conjunctivae normal.     Pupils: Pupils are equal, round, and reactive to light.  Neck:     Comments: Trachea midline, negative JVD Cardiovascular:     Rate and Rhythm: Normal rate and regular rhythm.     Heart sounds: No murmur. No gallop.   Pulmonary:     Effort: Pulmonary effort is normal. No respiratory distress.     Breath sounds: No wheezing, rhonchi or rales.  Musculoskeletal:        General: Normal range of motion.     Cervical back: Neck supple. No tenderness.     Right lower leg: No edema.     Left  lower leg: No edema.  Lymphadenopathy:     Cervical: No cervical adenopathy.  Skin:    Capillary Refill: Capillary refill takes less than 2 seconds.     Coloration: Skin is not jaundiced or pale.     Findings: No rash.  Neurological:     General: No focal deficit present.     Mental Status: She is alert and oriented to person, place, and time.      UC Treatments / Results  Labs (all labs ordered are listed, but only abnormal results are displayed) Labs Reviewed - No data to display  EKG   Radiology No results found.  Procedures Procedures (including critical care time)  Medications Ordered in UC Medications - No data to display  Initial Impression / Assessment and Plan / UC Course  I have reviewed the triage vital signs and the nursing notes.  Pertinent labs & imaging results that were available during my care of the patient were reviewed by me and considered in my medical decision making (see chart for details).     Patient afebrile, nontoxic.  No active chest pain or shortness of breath in office today.  Patient appears well: Low concern for cardiac involvement at this time.  Will resend benzonatate for patient to try for cough.  Use albuterol inhaler with spacing device for dyspnea.  Low concern for bacterial pneumonia this time, recommend patient follow-up with PCP for further evaluation/management if needed to rule out other nonacute etiologies.  Return precautions discussed, patient verbalized understanding and is agreeable to plan. Final Clinical Impressions(s) / UC Diagnoses   Final diagnoses:  [id number] virus infection     Discharge Instructions     User spacing device with inhaler. Take cough medication as needed. Very important to follow up with family doctor. Go to ER for worsening breathing (especially if inhaler does not help) chest pain, leg swelling.    ED Prescriptions    Medication Sig Dispense Auth. Provider   albuterol (VENTOLIN HFA) 108 (90  Base) MCG/ACT inhaler Inhale 2 puffs into the lungs every 4 (four) hours as needed for wheezing or shortness of breath. 18 g  Hall-Potvin, [place], PA-C   Spacer/Aero-Holding Chambers (AEROCHAMBER PLUS FLO-VU MEDIUM) MISC 1 each by Other route once for 1 dose. 1 each Hall-Potvin, [place], PA-C   benzonatate (TESSALON PERLES) 100 MG capsule Take 1 capsule (100 mg total) by mouth 3 (three) times daily as needed. 20 capsule Hall-Potvin, [place], PA-C     PDMP not reviewed this encounter.   Hall-Potvin, [place], [place] 02/02/19 2125

## 2019-02-02 NOTE — ED Triage Notes (Addendum)
Pt presents to [facility] for assessment after being tested positive for COVID 01/27/19.  C/o coughing, chest pain and shortness of breath, much worse at night, which makes her feel exhausted the next day.  Denies chest pain at this time.

## 2019-02-19 ENCOUNTER — Other Ambulatory Visit: Payer: Self-pay

## 2019-02-19 ENCOUNTER — Ambulatory Visit (INDEPENDENT_AMBULATORY_CARE_PROVIDER_SITE_OTHER): Payer: BC Managed Care – PPO | Admitting: Obstetrics & Gynecology

## 2019-02-19 ENCOUNTER — Encounter: Payer: Self-pay | Admitting: Obstetrics & Gynecology

## 2019-02-19 VITALS — BP 126/80 | HR 70 | Ht 66.0 in | Wt 194.0 lb

## 2019-02-19 DIAGNOSIS — Z124 Encounter for screening for malignant neoplasm of cervix: Secondary | ICD-10-CM

## 2019-02-19 DIAGNOSIS — Z113 Encounter for screening for infections with a predominantly sexual mode of transmission: Secondary | ICD-10-CM | POA: Diagnosis not present

## 2019-02-19 DIAGNOSIS — Z01419 Encounter for gynecological examination (general) (routine) without abnormal findings: Secondary | ICD-10-CM | POA: Diagnosis not present

## 2019-02-19 DIAGNOSIS — N920 Excessive and frequent menstruation with regular cycle: Secondary | ICD-10-CM | POA: Diagnosis not present

## 2019-02-19 DIAGNOSIS — B373 Candidiasis of vulva and vagina: Secondary | ICD-10-CM | POA: Diagnosis not present

## 2019-02-19 DIAGNOSIS — Z1151 Encounter for screening for human papillomavirus (HPV): Secondary | ICD-10-CM | POA: Diagnosis not present

## 2019-02-19 DIAGNOSIS — N898 Other specified noninflammatory disorders of vagina: Secondary | ICD-10-CM

## 2019-02-19 MED ORDER — METRONIDAZOLE 500 MG PO TABS: 500.0000 mg | ORAL_TABLET | Freq: Two times a day (BID) | ORAL | 0 refills | Status: DC

## 2019-02-19 NOTE — Progress Notes (Signed)
Subjective:    Misty Simpson is a 39 y.o. single P83 (39 yo) who presents for an annual exam. The patient has no complaints today. The patient is sexually active, but irregular due to decreased libido. She is not using contraception. She wants more kids GYN screening history: last pap: was normal. The patient wears seatbelts: yes. The patient participates in regular exercise: yes. Has the patient ever been transfused or tattooed?: yes. (both) The patient reports that there is not domestic violence in her life.   Menstrual History: OB History    Gravida  1   Para  1   Term  1   Preterm      AB      Living  1     SAB      TAB      Ectopic      Multiple      Live Births  1           Menarche age: 70 Patient's last menstrual period was 01/24/2019 (exact date).    The following portions of the patient's history were reviewed and updated as appropriate: allergies, current medications, past family history, past medical history, past social history, past surgical history and problem list.  Review of Systems Pertinent items are noted in HPI.   FH- no breast/gyn/colon cancer, mom had skin cancer Monogamous for 4 months, but has been with this fellow off and on for 12 years Works for [employer] Had her flu vaccine Mammogram UTD  Objective:    BP 126/80   Pulse 70   Ht 5\' 6"  (1.676 m)   Wt 194 lb (88 kg)   LMP 01/24/2019 (Exact Date)   BMI 31.31 kg/m   General Appearance:    Alert, cooperative, no distress, appears stated age  Head:    Normocephalic, without obvious abnormality, atraumatic  Eyes:    PERRL, conjunctiva/corneas clear, EOM's intact, fundi    benign, both eyes  Ears:    Normal TM's and external ear canals, both ears  Nose:   Nares normal, septum midline, mucosa normal, no drainage    or sinus tenderness  Throat:   Lips, mucosa, and tongue normal; teeth and gums normal  Neck:   Supple, symmetrical, trachea midline, no adenopathy;    thyroid:  no  enlargement/tenderness/nodules; no carotid   bruit or JVD  Back:     Symmetric, no curvature, ROM normal, no CVA tenderness  Lungs:     Clear to auscultation bilaterally, respirations unlabored  Chest Wall:    No tenderness or deformity   Heart:    Regular rate and rhythm, S1 and S2 normal, no murmur, rub   or gallop  Breast Exam:    No tenderness, masses, or nipple abnormality  Abdomen:     Soft, non-tender, bowel sounds active all four quadrants,    no masses, no organomegaly  Genitalia:    Normal female without lesionor tenderness, normal size and shape, anteverted, mobile, non-tender, normal adnexal exam Vaginal discharge c/w bv     Extremities:   Extremities normal, atraumatic, no cyanosis or edema  Pulses:   2+ and symmetric all extremities  Skin:   Skin color, texture, turgor normal, no rashes or lesions  Lymph nodes:   Cervical, supraclavicular, and axillary nodes normal  Neurologic:   CNII-XII intact, normal strength, sensation and reflexes    throughout  .    Assessment:    Healthy female exam.   Menorrhagia with essentially normal u/s  Desire for pregnancy Vaginal discharge c/w BV  Plan:     Thin prep Pap smear. with cotesting She declines Mirena/OCPs to manage her bleeding because she wants a pregnancy Rec MVI and iron daily Check CBC, TSH, and von willebrands We discussed the best time to conceive (around ovulation) Wet prep sent and flagyl prescribed

## 2019-02-20 LAB — CBC
Hematocrit: 39 % (ref 34.0–46.6)
Hemoglobin: 12.9 g/dL (ref 11.1–15.9)
MCH: 27.7 pg (ref 26.6–33.0)
MCHC: 33.1 g/dL (ref 31.5–35.7)
MCV: 84 fL (ref 79–97)
Platelets: 366 10*3/uL (ref 150–450)
RBC: 4.66 x10E6/uL (ref 3.77–5.28)
RDW: 13.5 % (ref 11.7–15.4)
WBC: 7.8 10*3/uL (ref 3.4–10.8)

## 2019-02-22 DIAGNOSIS — B379 Candidiasis, unspecified: Secondary | ICD-10-CM

## 2019-02-22 LAB — CERVICOVAGINAL ANCILLARY ONLY
Bacterial Vaginitis (gardnerella): NEGATIVE
Candida Glabrata: NEGATIVE
Candida Vaginitis: POSITIVE — AB
Comment: NEGATIVE
Comment: NEGATIVE
Comment: NEGATIVE

## 2019-02-22 LAB — TSH: TSH: 2.01 u[IU]/mL (ref 0.450–4.500)

## 2019-02-22 LAB — VON WILLEBRAND PANEL
Factor VIII Activity: 141 % — ABNORMAL HIGH (ref 56–140)
Von Willebrand Ag: 141 % (ref 50–200)
Von Willebrand Factor: 71 % (ref 50–200)

## 2019-02-22 LAB — COAG STUDIES INTERP REPORT

## 2019-02-22 MED ORDER — FLUCONAZOLE 150 MG PO TABS: 150.0000 mg | ORAL_TABLET | Freq: Once | ORAL | 0 refills | Status: AC

## 2019-02-23 LAB — CYTOLOGY - PAP
Adequacy: ABSENT
Chlamydia: NEGATIVE
Comment: NEGATIVE
Comment: NEGATIVE
Comment: NEGATIVE
Comment: NORMAL
Diagnosis: NEGATIVE
High risk HPV: NEGATIVE
Neisseria Gonorrhea: NEGATIVE
Trichomonas: NEGATIVE

## 2019-02-26 ENCOUNTER — Other Ambulatory Visit: Payer: Self-pay | Admitting: Obstetrics & Gynecology

## 2019-02-26 NOTE — Progress Notes (Signed)
Diflucan for yeast

## 2019-03-01 ENCOUNTER — Other Ambulatory Visit: Payer: Self-pay

## 2019-03-01 ENCOUNTER — Telehealth: Payer: Self-pay

## 2019-03-01 DIAGNOSIS — B373 Candidiasis of vulva and vagina: Secondary | ICD-10-CM

## 2019-03-01 DIAGNOSIS — B3731 Acute candidiasis of vulva and vagina: Secondary | ICD-10-CM

## 2019-03-01 MED ORDER — FLUCONAZOLE 150 MG PO TABS: 150.0000 mg | ORAL_TABLET | Freq: Once | ORAL | 0 refills | Status: AC

## 2019-03-01 NOTE — Telephone Encounter (Signed)
Patient called about missed call for results, advised  She had yeast on wet prep and that the medication diflucan was sent to her pharmacy.  She verbalized understanding and ended the call.

## 2019-03-01 NOTE — Telephone Encounter (Signed)
Called pt to advise of test results and Rx sent, no answer, left VM for pt to call back.

## 2019-03-01 NOTE — Telephone Encounter (Signed)
-----   Message from Emily Filbert, MD sent at 02/26/2019  9:37 AM EST ----- Please send her a prescription for diflucan. Her wet prep showed yeast. Thanks

## 2019-03-04 ENCOUNTER — Other Ambulatory Visit: Payer: Self-pay | Admitting: Lactation Services

## 2019-03-04 MED ORDER — FLUCONAZOLE 150 MG PO TABS: 150.0000 mg | ORAL_TABLET | Freq: Once | ORAL | 0 refills | Status: AC

## 2019-03-04 NOTE — Progress Notes (Signed)
Order resent as last one printed instead of sent to Pharmacy.

## 2019-03-19 ENCOUNTER — Encounter: Payer: Self-pay | Admitting: Family Medicine

## 2019-03-19 ENCOUNTER — Telehealth (INDEPENDENT_AMBULATORY_CARE_PROVIDER_SITE_OTHER): Payer: BC Managed Care – PPO | Admitting: Family Medicine

## 2019-03-19 ENCOUNTER — Other Ambulatory Visit: Payer: Self-pay

## 2019-03-19 DIAGNOSIS — F419 Anxiety disorder, unspecified: Secondary | ICD-10-CM

## 2019-03-19 DIAGNOSIS — Z3169 Encounter for other general counseling and advice on procreation: Secondary | ICD-10-CM | POA: Diagnosis not present

## 2019-03-19 DIAGNOSIS — N898 Other specified noninflammatory disorders of vagina: Secondary | ICD-10-CM | POA: Diagnosis not present

## 2019-03-19 DIAGNOSIS — N939 Abnormal uterine and vaginal bleeding, unspecified: Secondary | ICD-10-CM

## 2019-03-19 DIAGNOSIS — F32A Depression, unspecified: Secondary | ICD-10-CM | POA: Insufficient documentation

## 2019-03-19 MED ORDER — MEGESTROL ACETATE 40 MG PO TABS: 40.0000 mg | ORAL_TABLET | Freq: Two times a day (BID) | ORAL | 3 refills | Status: DC

## 2019-03-19 MED ORDER — FLUCONAZOLE 150 MG PO TABS: 150.0000 mg | ORAL_TABLET | Freq: Every day | ORAL | 2 refills | Status: DC

## 2019-03-19 NOTE — Progress Notes (Signed)
TELEHEALTH GYNECOLOGY VIRTUAL VIDEO VISIT ENCOUNTER NOTE  Provider location: [facility] at [place]   I connected with Misty Simpson on 03/19/19 at  8:15 AM EST by MyChart Video Encounter at home and verified that I am speaking with the correct person using two identifiers.   I discussed the limitations, risks, security and privacy concerns of performing an evaluation and management service virtually and the availability of in person appointments. I also discussed with the patient that there may be a patient responsible charge related to this service. The patient expressed understanding and agreed to proceed.   History:  Misty Simpson is a 39 y.o. G64P1001 female being evaluated today for abnormal bleeding. W/u with Dr. Hulan Simpson, end of January, essentially normal u/s, blood work. No evidence of thyroid issues, VWB Disease. Not anemic. Still with heavy cycles and passing clots. Attempting pregnancy. Knows about timed intercourse. Will resume PNVs. She is having a lot of anxiety. Will reach out to [place]. She reports minimal abnormal vaginal discharge + for yeast at last visit. Denies pelvic pain.       Past Medical History:  Diagnosis Date  . Kidney stones    History reviewed. No pertinent surgical history. The following portions of the patient's history were reviewed and updated as appropriate: allergies, current medications, past family history, past medical history, past social history, past surgical history and problem list.   Health Maintenance:  Normal pap and negative HRHPV on 02/19/2019.    Review of Systems:  Pertinent items noted in HPI and remainder of comprehensive ROS otherwise negative.  Physical Exam:   General:  Alert, oriented and cooperative. Patient appears to be in no acute distress.  Mental Status: Normal mood and affect. Normal behavior. Normal judgment and thought content.   Respiratory: Normal respiratory effort, no problems with  respiration noted  Rest of physical exam deferred due to type of encounter  Labs and Imaging No results found for this or any previous visit (from the past 336 hour(s)). No results found.     Assessment and Plan:     Problem List Items Addressed This Visit      Unprioritized   Abnormal uterine bleeding (AUB) - Primary    Begin Megace with cycles to help decrease flow, as she is uninterested in contraception. If she is not pregnant in 6 months, will need referral to REI.      Relevant Medications   megestrol (MEGACE) 40 MG tablet   Anxiety    To reach out to Misty Simpson--advised of impact on overall health.       Other Visit Diagnoses    Vaginal discharge       Relevant Medications   fluconazole (DIFLUCAN) 150 MG tablet   Encounter for preconception consultation       Begin PNV's. Timing of intercourse. Folic acid. Referral to REI if no pregnancy in 6 months.   Relevant Medications   folic acid (FOLVITE) [id number] MCG tablet   Biotin 1 MG CAPS       I discussed the assessment and treatment plan with the patient. The patient was provided an opportunity to ask questions and all were answered. The patient agreed with the plan and demonstrated an understanding of the instructions.   The patient was advised to call back or seek an in-person evaluation/go to the ED if the symptoms worsen or if the condition fails to improve as anticipated.  I provided 11 minutes of face-to-face time during this encounter.  Donnamae Jude, MD [facility], [place]

## 2019-03-19 NOTE — Assessment & Plan Note (Signed)
Begin Megace with cycles to help decrease flow, as she is uninterested in contraception. If she is not pregnant in 6 months, will need referral to REI.

## 2019-03-19 NOTE — Assessment & Plan Note (Signed)
To reach out to Misty Simpson--advised of impact on overall health.

## 2019-03-19 NOTE — Progress Notes (Signed)
I connected with  Misty Simpson on 03/19/19 at  8:15 AM EST by telephone and verified that I am speaking with the correct person using two identifiers.   I discussed the limitations, risks, security and privacy concerns of performing an evaluation and management service by telephone and the availability of in person appointments. I also discussed with the patient that there may be a patient responsible charge related to this service. The patient expressed understanding and agreed to proceed.  Derinda Late, RN 03/19/2019  8:15 AM

## 2019-03-26 NOTE — BH Specialist Note (Signed)
Pt did not arrive to video visit and did not answer the phone ; Left HIPPA-compliant message to call back Roselyn Reef from [facility] at [phone].  ; left MyChart message for patient.     [place] via Telemedicine Video Visit  03/26/2019 Misty Simpson [id number]  [name]

## 2019-03-29 ENCOUNTER — Ambulatory Visit: Payer: BC Managed Care – PPO | Admitting: Clinical

## 2019-03-29 DIAGNOSIS — Z91199 Patient's noncompliance with other medical treatment and regimen due to unspecified reason: Secondary | ICD-10-CM

## 2019-03-29 DIAGNOSIS — Z5329 Procedure and treatment not carried out because of patient's decision for other reasons: Secondary | ICD-10-CM

## 2020-11-27 ENCOUNTER — Other Ambulatory Visit (HOSPITAL_BASED_OUTPATIENT_CLINIC_OR_DEPARTMENT_OTHER): Payer: Self-pay | Admitting: Family Medicine

## 2020-11-27 DIAGNOSIS — Z1231 Encounter for screening mammogram for malignant neoplasm of breast: Secondary | ICD-10-CM

## 2020-11-27 NOTE — Progress Notes (Signed)
GYNECOLOGY ANNUAL PREVENTATIVE CARE ENCOUNTER NOTE  History:     Misty Simpson is a 40 y.o. G25P1001 female here for a routine annual gynecologic exam.    Current complaints: heavy bleeding.  They are regular and monthly. Bleeding through pads/tampons and it is affected her ability to function at work. She has bled enough in the past that she had to have a blood transfusion.  She was given Megace in the past but never took it.   She only otherwise notes bilateral breast tenderness. She has mood swings before her period and has noted some weight gain.   She is not done with the possibility of child-bearing although she does not have a current partner. She had a prior partner of 12 years and they did not conceive. He had no other children. She has a son from 27 years ago.   Denies abnormal vaginal bleeding, discharge, pelvic pain, problems with intercourse or other gynecologic concerns.     Gynecologic History Patient's last menstrual period was 11/28/2020. Contraception: none Last Pap: 01/2019. Result was normal with normal HPV Last Mammogram: 2020.  Result was normal. Has mammogram in December.    Obstetric History OB History  Gravida Para Term Preterm AB Living  1 1 1     1   SAB IAB Ectopic Multiple Live Births          1    # Outcome Date GA Lbr Len/2nd Weight Sex Delivery Anes PTL Lv  1 Term 03/30/03    Jerilynn Mages Vag-Spont   LIV    Past Medical History:  Diagnosis Date   Kidney stones     History reviewed. No pertinent surgical history.  Current Outpatient Medications on File Prior to Visit  Medication Sig Dispense Refill   albuterol (VENTOLIN HFA) 108 (90 Base) MCG/ACT inhaler Inhale 2 puffs into the lungs every 4 (four) hours as needed for wheezing or shortness of breath. (Patient not taking: Reported on 11/29/2020) 18 g 0   Biotin 1 MG CAPS Take by mouth. (Patient not taking: Reported on 11/29/2020)     ferrous sulfate 325 (65 FE) MG tablet Take 325 mg by mouth  daily with breakfast. (Patient not taking: Reported on 11/29/2020)     fluconazole (DIFLUCAN) 150 MG tablet Take 1 tablet (150 mg total) by mouth daily. Repeat in 24 hours if needed (Patient not taking: Reported on 11/29/2020) 2 tablet 2   folic acid (FOLVITE) 161 MCG tablet Take 400 mcg by mouth daily. (Patient not taking: Reported on 11/29/2020)     magnesium 30 MG tablet Take 30 mg by mouth 2 (two) times daily. (Patient not taking: Reported on 11/29/2020)     megestrol (MEGACE) 40 MG tablet Take 1 tablet (40 mg total) by mouth 2 (two) times daily. Take with cycles only to decrease the amount of bleeding (Patient not taking: Reported on 11/29/2020) 30 tablet 3   Multiple Vitamin (MULTIVITAMIN) capsule Take 1 capsule by mouth daily. (Patient not taking: Reported on 11/29/2020)     vitamin B-12 (CYANOCOBALAMIN) 100 MCG tablet Take 100 mcg by mouth daily. (Patient not taking: Reported on 11/29/2020)     No current facility-administered medications on file prior to visit.    No Known Allergies  Social History:  reports that she has never smoked. She has never used smokeless tobacco. She reports current alcohol use. She reports that she does not use drugs.  History reviewed. No pertinent family history.  The following portions of the patient's  history were reviewed and updated as appropriate: allergies, current medications, past family history, past medical history, past social history, past surgical history and problem list.  Review of Systems Pertinent items noted in HPI and remainder of comprehensive ROS otherwise negative.  Physical Exam:  BP 133/76   Pulse 72   Wt 197 lb 4.8 oz (89.5 kg)   LMP 11/28/2020   BMI 31.85 kg/m  CONSTITUTIONAL: Well-developed, well-nourished female in no acute distress.  HENT:  Normocephalic, atraumatic, External right and left ear normal.  EYES: Conjunctivae and EOM are normal. Pupils are equal, round, and reactive to light. No scleral icterus.  NECK: Normal  range of motion, supple, no masses.  Normal thyroid.  SKIN: Skin is warm and dry. No rash noted. Not diaphoretic. No erythema. No pallor. MUSCULOSKELETAL: Normal range of motion. No tenderness.  No cyanosis, clubbing, or edema. NEUROLOGIC: Alert and oriented to person, place, and time. Normal reflexes, muscle tone coordination.  PSYCHIATRIC: Normal mood and affect. Normal behavior. Normal judgment and thought content.  CARDIOVASCULAR: Normal heart rate noted, regular rhythm RESPIRATORY: Clear to auscultation bilaterally. Effort and breath sounds normal, no problems with respiration noted.  BREASTS: Symmetric in size. No masses, tenderness, skin changes, nipple drainage, or lymphadenopathy bilaterally. Performed in the presence of a chaperone. ABDOMEN: Soft, no distention noted.  No tenderness, rebound or guarding.  PELVIC: External genitalia normal, Vagina normal without discharge, Urethra without abnormality or discharge, mild-mod bladder tenderness, cervix normal in appearance, no CMT, uterus normal size, shape, and consistency, no adnexal masses or tenderness, right levator tenderness. Performed in the presence of a chaperone.  Labs/Imaging 01/2018: Normal [place] except 2 cm subserosal fibroid VWD w/u negative 01/2019 01/2019: pap wnl, HPV negative 01/2019: CBC wnl -- HgB was 12.9.  01/2019: TSH wnl  Assessment and Plan:    1. Abnormal uterine bleeding (AUB) - We will recheck [place] to ensure no change since 2020 - At this time, I would not check an EMB unless cycle becomes irregular or mid-cycle spotting or some other indication by [place].  - We discussed the options for AUB (heavy bleeding) including expectant management, TXA, hormonal therapy consisting of birth control, Megace.  - We discussed surgical options in this setting would be reserved for someone who is done with child-bearing.  - Discussed location of her fibroid and that this would not be contributing to her bleeding at this time - She  would like to do low dose OCP following consideration of her options - F/u in 3 months   2. Well woman exam with routine gynecological exam - Cervical cancer screening: Discussed guidelines. Pap with HPV wnl on 01/2019 - STD Testing: accepts - Birth Control: Discussed options and their risks, benefits and common side effects; discussed VTE with estrogen containing options. Desires: OCPs - Breast Health: Encouraged self breast awareness/SBE. Teaching provided. Discussed limits of clinical breast exam for detecting breast cancer. Rx given for MXR - F/U 12 months and prn  3. Levator tenderness - She has pelvic floor dysfunction related to the levators, right more so. She has some bladder tenderness.  - Recommended PFPT  Routine preventative health maintenance measures emphasized. Please refer to After Visit Summary for other counseling recommendations.       Radene Gunning, MD, [facility], [place]

## 2020-11-29 ENCOUNTER — Encounter: Payer: Self-pay | Admitting: Obstetrics and Gynecology

## 2020-11-29 ENCOUNTER — Other Ambulatory Visit: Payer: Self-pay

## 2020-11-29 ENCOUNTER — Ambulatory Visit (INDEPENDENT_AMBULATORY_CARE_PROVIDER_SITE_OTHER): Payer: BC Managed Care – PPO | Admitting: Obstetrics and Gynecology

## 2020-11-29 ENCOUNTER — Other Ambulatory Visit (HOSPITAL_COMMUNITY)
Admission: RE | Admit: 2020-11-29 | Discharge: 2020-11-29 | Disposition: A | Discharge status: Home / Self Care | Payer: BC Managed Care – PPO | Source: Ambulatory Visit | Attending: Obstetrics and Gynecology | Admitting: Obstetrics and Gynecology

## 2020-11-29 VITALS — BP 133/76 | HR 72 | Wt 197.3 lb

## 2020-11-29 DIAGNOSIS — Z01419 Encounter for gynecological examination (general) (routine) without abnormal findings: Secondary | ICD-10-CM

## 2020-11-29 DIAGNOSIS — M62838 Other muscle spasm: Secondary | ICD-10-CM

## 2020-11-29 DIAGNOSIS — N939 Abnormal uterine and vaginal bleeding, unspecified: Secondary | ICD-10-CM

## 2020-11-29 MED ORDER — NORETHIN ACE-ETH ESTRAD-FE 1-20 MG-MCG PO TABS: 1.0000 | ORAL_TABLET | Freq: Every day | ORAL | 11 refills | Status: DC

## 2020-11-30 LAB — CERVICOVAGINAL ANCILLARY ONLY
Bacterial Vaginitis (gardnerella): NEGATIVE
Candida Glabrata: NEGATIVE
Candida Vaginitis: NEGATIVE
Chlamydia: NEGATIVE
Comment: NEGATIVE
Comment: NEGATIVE
Comment: NEGATIVE
Comment: NEGATIVE
Comment: NEGATIVE
Comment: NORMAL
Neisseria Gonorrhea: NEGATIVE
Trichomonas: NEGATIVE

## 2020-11-30 LAB — CBC
Hematocrit: 36.6 % (ref 34.0–46.6)
Hemoglobin: 12.3 g/dL (ref 11.1–15.9)
MCH: 26.2 pg — ABNORMAL LOW (ref 26.6–33.0)
MCHC: 33.6 g/dL (ref 31.5–35.7)
MCV: 78 fL — ABNORMAL LOW (ref 79–97)
Platelets: 357 10*3/uL (ref 150–450)
RBC: 4.69 x10E6/uL (ref 3.77–5.28)
RDW: 13.1 % (ref 11.7–15.4)
WBC: 7.3 10*3/uL (ref 3.4–10.8)

## 2020-11-30 LAB — RPR: RPR Ser Ql: NONREACTIVE

## 2020-11-30 LAB — HEPATITIS B SURFACE ANTIGEN: Hepatitis B Surface Ag: NEGATIVE

## 2020-11-30 LAB — HIV ANTIBODY (ROUTINE TESTING W REFLEX): HIV Screen 4th Generation wRfx: NONREACTIVE

## 2020-12-27 ENCOUNTER — Telehealth (HOSPITAL_BASED_OUTPATIENT_CLINIC_OR_DEPARTMENT_OTHER): Payer: Self-pay

## 2021-01-01 ENCOUNTER — Encounter (HOSPITAL_BASED_OUTPATIENT_CLINIC_OR_DEPARTMENT_OTHER): Payer: Self-pay

## 2021-01-01 ENCOUNTER — Ambulatory Visit (HOSPITAL_BASED_OUTPATIENT_CLINIC_OR_DEPARTMENT_OTHER)
Admission: RE | Admit: 2021-01-01 | Discharge: 2021-01-01 | Disposition: A | Discharge status: Home / Self Care | Payer: BC Managed Care – PPO | Source: Ambulatory Visit | Attending: Family Medicine | Admitting: Family Medicine

## 2021-01-01 ENCOUNTER — Other Ambulatory Visit: Payer: Self-pay

## 2021-01-01 DIAGNOSIS — Z1231 Encounter for screening mammogram for malignant neoplasm of breast: Secondary | ICD-10-CM | POA: Insufficient documentation

## 2021-03-25 ENCOUNTER — Encounter: Payer: Self-pay | Admitting: Obstetrics and Gynecology

## 2021-03-27 ENCOUNTER — Ambulatory Visit: Payer: BC Managed Care – PPO

## 2021-03-28 ENCOUNTER — Ambulatory Visit (INDEPENDENT_AMBULATORY_CARE_PROVIDER_SITE_OTHER): Payer: BC Managed Care – PPO | Admitting: General Practice

## 2021-03-28 ENCOUNTER — Other Ambulatory Visit: Payer: Self-pay

## 2021-03-28 VITALS — BP 127/69 | HR 67 | Ht 66.0 in | Wt 200.0 lb

## 2021-03-28 DIAGNOSIS — R350 Frequency of micturition: Secondary | ICD-10-CM

## 2021-03-28 LAB — POCT URINALYSIS DIP (DEVICE)
Bilirubin Urine: NEGATIVE
Glucose, UA: NEGATIVE mg/dL
Hgb urine dipstick: NEGATIVE
Ketones, ur: NEGATIVE mg/dL
Leukocytes,Ua: NEGATIVE
Nitrite: NEGATIVE
Protein, ur: NEGATIVE mg/dL
Specific Gravity, Urine: 1.02 (ref 1.005–1.030)
Urobilinogen, UA: 0.2 mg/dL (ref 0.0–1.0)
pH: 7 (ref 5.0–8.0)

## 2021-03-28 NOTE — Progress Notes (Signed)
Patient presents to the office today with concern of possible UTI. Patient reports urinary frequency, abdominal pain in the morning with urination, & "feeling like she has to push her urine out" for the past several days. She does report some constipation over the past couple of months. UA is unremarkable. Per Dr Dione Plover, recommend urine culture to rule out infection. Discussed with patient and also reviewed strategies for constipation management. Also discussed scheduling appt to initiate primary care especially if constipation didn't resolve. Patient verbalized understanding to all.  ? ?Koren Bound RN BSN ?03/28/21 ? ?

## 2021-03-30 LAB — URINE CULTURE

## 2021-05-01 ENCOUNTER — Encounter: Payer: Self-pay | Admitting: Nurse Practitioner

## 2021-05-01 ENCOUNTER — Ambulatory Visit: Payer: 59 | Admitting: Nurse Practitioner

## 2021-05-01 VITALS — BP 124/82 | HR 59 | Temp 97.5°F | Ht 65.5 in | Wt 194.8 lb

## 2021-05-01 DIAGNOSIS — R1031 Right lower quadrant pain: Secondary | ICD-10-CM | POA: Insufficient documentation

## 2021-05-01 DIAGNOSIS — K625 Hemorrhage of anus and rectum: Secondary | ICD-10-CM | POA: Diagnosis not present

## 2021-05-01 DIAGNOSIS — K649 Unspecified hemorrhoids: Secondary | ICD-10-CM | POA: Diagnosis not present

## 2021-05-01 DIAGNOSIS — R69 Illness, unspecified: Secondary | ICD-10-CM | POA: Diagnosis not present

## 2021-05-01 DIAGNOSIS — F419 Anxiety disorder, unspecified: Secondary | ICD-10-CM

## 2021-05-01 LAB — COMPREHENSIVE METABOLIC PANEL
ALT: 12 U/L (ref 0–35)
AST: 12 U/L (ref 0–37)
Albumin: 4.4 g/dL (ref 3.5–5.2)
Alkaline Phosphatase: 68 U/L (ref 39–117)
BUN: 12 mg/dL (ref 6–23)
CO2: 22 mEq/L (ref 19–32)
Calcium: 8.8 mg/dL (ref 8.4–10.5)
Chloride: 107 mEq/L (ref 96–112)
Creatinine, Ser: 0.61 mg/dL (ref 0.40–1.20)
GFR: 111.76 mL/min (ref >60.00)
Glucose, Bld: 88 mg/dL (ref 70–99)
Potassium: 4.2 mEq/L (ref 3.5–5.1)
Sodium: 136 mEq/L (ref 135–145)
Total Bilirubin: 0.6 mg/dL (ref 0.2–1.2)
Total Protein: 7.1 g/dL (ref 6.0–8.3)

## 2021-05-01 LAB — TSH: TSH: 2.12 u[IU]/mL (ref 0.35–5.50)

## 2021-05-01 LAB — CBC WITH DIFFERENTIAL/PLATELET
Basophils Absolute: 0 10*3/uL (ref 0.0–0.1)
Basophils Relative: 0.6 % (ref 0.0–3.0)
Eosinophils Absolute: 0.6 10*3/uL (ref 0.0–0.7)
Eosinophils Relative: 8.3 % — ABNORMAL HIGH (ref 0.0–5.0)
HCT: 37.9 % (ref 36.0–46.0)
Hemoglobin: 13.1 g/dL (ref 12.0–15.0)
Lymphocytes Relative: 26.9 % (ref 12.0–46.0)
Lymphs Abs: 1.9 10*3/uL (ref 0.7–4.0)
MCHC: 34.5 g/dL (ref 30.0–36.0)
MCV: 80.6 fl (ref 78.0–100.0)
Monocytes Absolute: 0.5 10*3/uL (ref 0.1–1.0)
Monocytes Relative: 7.4 % (ref 3.0–12.0)
Neutro Abs: 4 10*3/uL (ref 1.4–7.7)
Neutrophils Relative %: 56.8 % (ref 43.0–77.0)
Platelets: 273 10*3/uL (ref 150.0–400.0)
RBC: 4.7 Mil/uL (ref 3.87–5.11)
RDW: 14.7 % (ref 11.5–15.5)
WBC: 7.1 10*3/uL (ref 4.0–10.5)

## 2021-05-01 MED ORDER — POLYETHYLENE GLYCOL 3350 17 GM/SCOOP PO POWD: 17.0000 g | Freq: Every day | ORAL | 1 refills | Status: DC

## 2021-05-01 MED ORDER — HYDROCORTISONE ACETATE 25 MG RE SUPP: 25.0000 mg | Freq: Two times a day (BID) | RECTAL | 0 refills | Status: DC

## 2021-05-01 NOTE — Assessment & Plan Note (Addendum)
Ongoing intermittently for 1 year. Denies changes in bowel movements, although she has chronic constipation. Exam did show internal hemorrhoid. See plan for hemorrhoids above. Check CBC and iron panel today.   ?

## 2021-05-01 NOTE — Progress Notes (Signed)
? ?New Patient Office Visit ? ?Subjective:  ?Patient ID: Misty Simpson, female    DOB: [birth year]  Age: 41 y.o. MRN: [id number] ? ?CC:  ?Chief Complaint  ?Patient presents with  ? Establish Care  ?  Np. Est care. Pt c/o frequent bloating, lower Lt abd pain, and blood in stool x1 yr. Pt also states painful intercourse for several months  ? ? ?HPI ?Misty Simpson presents for new patient visit to establish care.  Introduced to Designer, jewellery role and practice setting.  All questions answered.  Discussed provider/patient relationship and expectations. ? ?She has been noticing abdominal bloating, right lower quadrant pain and blood with her bowel movements for the last year. The pain worsens with bowel movements, however a discomfort is there all the time. She thought she had a UTI, but tested negative. Describes the blood as bright red on the outside of the stool. She is having discomfort with sexual intercourse and she followed up with her GYN and was told it's not her ovaries. She had recent negative STD testing. The pain does not radiate. Denies nausea, vomiting, diarrhea, and fevers. She does endorse constipation. She believes that she may have a hemorrhoid. Potatoes and white rice upset her stomach, so she avoids these triggering foods.  ? ?She has a history of anxiety and it has worsened recently. Her son went to college this year and he is her only close family. She also endorses stress at work. She has a boyfriend who helps supports her and she is able to talk with him. She also exercises 5 days a week and has a dog who helps with her anxiety. She took lexapro in the past, but didn't like the way it made her feel. She is not interested in starting medication.  ? ? ?  05/01/2021  ? 10:19 AM 11/29/2020  ?  3:46 PM 03/19/2019  ?  8:18 AM 02/19/2019  ?  8:47 AM  ?Depression screen PHQ 2/9  ?Decreased Interest 0 '1 1 1  '$ ?Down, Depressed, Hopeless '1 1 1 1  '$ ?PHQ - 2 Score '1 2 2 2  '$ ?Altered sleeping '1 1 1  1  '$ ?Tired, decreased energy '1 1 2 1  '$ ?Change in appetite 1 1 0 1  ?Feeling bad or failure about yourself  1 1 0 1  ?Trouble concentrating 0 0 0 1  ?Moving slowly or fidgety/restless 0 0 0 0  ?Suicidal thoughts 0 0 0 0  ?PHQ-9 Score '5 6 5 7  '$ ?Difficult doing work/chores Somewhat difficult     ? ? ?  05/01/2021  ? 10:19 AM 11/29/2020  ?  3:46 PM 03/19/2019  ?  8:20 AM 02/19/2019  ?  8:47 AM  ?GAD 7 : Generalized Anxiety Score  ?Nervous, Anxious, on Edge '2 1 1 1  '$ ?Control/stop worrying '2 1 2 1  '$ ?Worry too much - different things '2 1 2 1  '$ ?Trouble relaxing '2 1 2 1  '$ ?Restless 2 0 0 0  ?Easily annoyed or irritable 0 1 2 0  ?Afraid - awful might happen '2 1 2 1  '$ ?Total GAD 7 Score '12 6 11 5  '$ ?Anxiety Difficulty Somewhat difficult     ?  ? ?Past Medical History:  ?Diagnosis Date  ? History of blood transfusion   ? Kidney stones   ? Ovarian cyst   ? ? ?History reviewed. No pertinent surgical history. ? ?Family History  ?Problem Relation Age of Onset  ? Cancer Mother   ?  skin  ? Heart disease Father   ? ? ?Social History  ? ?Socioeconomic History  ? Marital status: Single  ?  Spouse name: Not on file  ? Number of children: Not on file  ? Years of education: Not on file  ? Highest education level: Not on file  ?Occupational History  ? Not on file  ?Tobacco Use  ? Smoking status: Never  ? Smokeless tobacco: Never  ?Vaping Use  ? Vaping Use: Never used  ?Substance and Sexual Activity  ? Alcohol use: Not Currently  ? Drug use: No  ? Sexual activity: Yes  ?  Birth control/protection: Condom  ?Other Topics Concern  ? Not on file  ?Social History Narrative  ? Not on file  ? ?Social Determinants of Health  ? ?Financial Resource Strain: Not on file  ?Food Insecurity: Not on file  ?Transportation Needs: Not on file  ?Physical Activity: Not on file  ?Stress: Not on file  ?Social Connections: Not on file  ?Intimate Partner Violence: Not on file  ? ? ?ROS ?Review of Systems  ?Constitutional:  Positive for fatigue.  ?HENT:  Positive for  congestion.   ?Eyes: Negative.   ?Respiratory: Negative.    ?Cardiovascular: Negative.   ?Gastrointestinal:  Positive for abdominal pain (RLQ), blood in stool and constipation. Negative for diarrhea and nausea.  ?Genitourinary: Negative.   ?Musculoskeletal: Negative.   ?Skin: Negative.   ?Allergic/Immunologic: Positive for environmental allergies.  ?Neurological: Negative.   ?Psychiatric/Behavioral:  The patient is nervous/anxious (one episode a month).   ? ?Objective:  ? ?Today's Vitals: BP 124/82 (BP Location: Right Arm, Patient Position: Sitting, Cuff Size: Normal)   Pulse (!) 59   Temp (!) 97.5 ?F (36.4 ?C) (Temporal)   Ht 5' 5.5" (1.664 m)   Wt 194 lb 12.8 oz (88.4 kg)   LMP 04/08/2021 (Exact Date)   SpO2 98%   BMI 31.92 kg/m?  ? ?Physical Exam ?Vitals and nursing note reviewed. Exam conducted with a chaperone present.  ?Constitutional:   ?   General: She is not in acute distress. ?   Appearance: Normal appearance.  ?HENT:  ?   Head: Normocephalic.  ?Eyes:  ?   Conjunctiva/sclera: Conjunctivae normal.  ?Cardiovascular:  ?   Rate and Rhythm: Normal rate and regular rhythm.  ?   Pulses: Normal pulses.  ?   Heart sounds: Normal heart sounds.  ?Pulmonary:  ?   Effort: Pulmonary effort is normal.  ?   Breath sounds: Normal breath sounds.  ?Abdominal:  ?   Palpations: Abdomen is soft.  ?   Tenderness: There is abdominal tenderness in the right lower quadrant. There is no guarding or rebound. Negative signs include Murphy's sign and McBurney's sign.  ?Genitourinary: ?   Rectum: Tenderness and internal hemorrhoid present. No anal fissure.  ?Musculoskeletal:  ?   Cervical back: Normal range of motion and neck supple. No tenderness.  ?Lymphadenopathy:  ?   Cervical: No cervical adenopathy.  ?Skin: ?   General: Skin is warm.  ?Neurological:  ?   General: No focal deficit present.  ?   Mental Status: She is alert and oriented to person, place, and time.  ?Psychiatric:     ?   Mood and Affect: Mood normal.     ?    Behavior: Behavior normal.     ?   Thought Content: Thought content normal.     ?   Judgment: Judgment normal.  ? ? ?Assessment & Plan:  ? ?  Problem List Items Addressed This Visit   ? ?  ? Cardiovascular and Mediastinum  ? Hemorrhoids  ?  Internal hemorrhoid palpated on exam. Will treat with annusol suppository twice a day. Discussed trying to limit straining and constipation. Start miralax daily. Encouraged her to drink plenty of fluids. ?  ?  ?  ? Digestive  ? Rectal bleeding - Primary  ?  Ongoing intermittently for 1 year. Denies changes in bowel movements, although she has chronic constipation. Exam did show internal hemorrhoid. See plan for hemorrhoids above. Check CBC and iron panel today.   ?  ?  ? Relevant Orders  ? CBC with Differential/Platelet (Completed)  ? Iron, TIBC and Ferritin Panel  ?  ? Other  ? Anxiety  ?  Chronic, ongoing. She states that her anxiety has worsened recently with her increased stress at work. She was on lexapro in the past, however she did not like the way it made her feel. She would like a referral for therapy. Order placed.  ?  ?  ? Relevant Orders  ? TSH (Completed)  ? Ambulatory referral to Psychology  ? Right lower quadrant abdominal pain  ?  Ongoing RLQ pain for a year. She does have tenderness to a large area of her RLQ and side. Will check CMP, CBC, and CT abdomen. Discussed ER precautions. Treating for constipation as her pain is worse with bowel movements. Follow up in 4 weeks or sooner based on results.  ?  ?  ? Relevant Orders  ? CBC with Differential/Platelet (Completed)  ? Comprehensive metabolic panel (Completed)  ? CT Abdomen Pelvis W Contrast  ? ? ?Outpatient Encounter Medications as of 05/01/2021  ?Medication Sig  ? hydrocortisone (ANUSOL-HC) 25 MG suppository Place 1 suppository (25 mg total) rectally 2 (two) times daily.  ? polyethylene glycol powder (GLYCOLAX/MIRALAX) 17 GM/SCOOP powder Take 17 g by mouth daily.  ? MAGNESIUM GLYCINATE PO Take by mouth.  ?  Multiple Vitamin (MULTIVITAMIN) capsule Take 1 capsule by mouth daily.  ? Turmeric (QC TUMERIC COMPLEX PO) Take by mouth.  ? ?No facility-administered encounter medications on file as of 05/01/2021.  ? ? ?Follow-up: [facility]

## 2021-05-01 NOTE — Assessment & Plan Note (Signed)
Internal hemorrhoid palpated on exam. Will treat with annusol suppository twice a day. Discussed trying to limit straining and constipation. Start miralax daily. Encouraged her to drink plenty of fluids. ?

## 2021-05-01 NOTE — Assessment & Plan Note (Signed)
Chronic, ongoing. She states that her anxiety has worsened recently with her increased stress at work. She was on lexapro in the past, however she did not like the way it made her feel. She would like a referral for therapy. Order placed.  ?

## 2021-05-01 NOTE — Assessment & Plan Note (Signed)
Ongoing RLQ pain for a year. She does have tenderness to a large area of her RLQ and side. Will check CMP, CBC, and CT abdomen. Discussed ER precautions. Treating for constipation as her pain is worse with bowel movements. Follow up in 4 weeks or sooner based on results.  ?

## 2021-05-01 NOTE — Patient Instructions (Signed)
It was great to see you! ? ?We are checking some labs today and will let you know the results.  ? ?Start miralax daily as needed for constipation. Make sure you are drinking plenty of fluids.  ? ?I am placing a referral for a therapist for you.  ? ?Let's follow-up in 4 weeks, sooner if you have concerns. ? ?If a referral was placed today, you will be contacted for an appointment. Please note that routine referrals can sometimes take up to 3-4 weeks to process. Please call our office if you haven't heard anything after this time frame. ? ?Take care, ? ?Vance Peper, NP ? ?

## 2021-05-02 LAB — IRON,TIBC AND FERRITIN PANEL
%SAT: 15 % (calc) — ABNORMAL LOW (ref 16–45)
Ferritin: 12 ng/mL — ABNORMAL LOW (ref 16–154)
Iron: 64 ug/dL (ref 40–190)
TIBC: 418 mcg/dL (calc) (ref 250–450)

## 2021-05-28 NOTE — Progress Notes (Addendum)
? ?Established Visit ? ?BP 120/78 (BP Location: Right Arm, Patient Position: Sitting, Cuff Size: Normal)   Pulse 67   Temp (!) 96.9 ?F (36.1 ?C) (Temporal)   Wt 197 lb 6.4 oz (89.5 kg)   SpO2 98%   BMI 32.35 kg/m?   ? ?Subjective:  ? ? Patient ID: Misty Simpson, female    DOB: [birth year], 41 y.o.   MRN: [id number] ? ?CC: ?Chief Complaint  ?Patient presents with  ? Follow-up  ?  4 wk f/u  ? ? ?HPI: ?Misty Simpson is a 41 y.o. female who presents for follow-up on RLQ abdominal pain and rectal bleeding.  ? ?Her rectal bleeding has stopped and her stools are more regular. She is still taking the miralax daily. She is also still having RLQ abdominal pain. Her GYN has her scheduled for a transvaginal ultrasound to make sure her ovarian cyst hasn't gotten larger. She states that the pain is worse in the mornings when she wakes up and then decreased throughout the day. She endorses bloating and pain during intercourse. She denies nausea, fevers, and vomiting. She has not heard from the imaging place to schedule her CT. ? ?Past Medical History:  ?Diagnosis Date  ? History of blood transfusion   ? Kidney stones   ? Ovarian cyst   ? ? ?History reviewed. No pertinent surgical history. ? ?Family History  ?Problem Relation Age of Onset  ? Cancer Mother   ?     skin  ? Heart disease Father   ?  ? ?Current Outpatient Medications on File Prior to Visit  ?Medication Sig Dispense Refill  ? hydrocortisone (ANUSOL-HC) 25 MG suppository Place 1 suppository (25 mg total) rectally 2 (two) times daily. 12 suppository 0  ? MAGNESIUM GLYCINATE PO Take by mouth.    ? Multiple Vitamin (MULTIVITAMIN) capsule Take 1 capsule by mouth daily.    ? polyethylene glycol powder (GLYCOLAX/MIRALAX) 17 GM/SCOOP powder Take 17 g by mouth daily. 3350 g 1  ? Turmeric (QC TUMERIC COMPLEX PO) Take by mouth.    ? ?No current facility-administered medications on file prior to visit.  ?  ? ?Review of Systems ?See pertinent positives and  negatives per HPI. ? ?   ?Objective:  ?  ?BP 120/78 (BP Location: Right Arm, Patient Position: Sitting, Cuff Size: Normal)   Pulse 67   Temp (!) 96.9 ?F (36.1 ?C) (Temporal)   Wt 197 lb 6.4 oz (89.5 kg)   SpO2 98%   BMI 32.35 kg/m?   ?Wt Readings from Last 3 Encounters:  ?05/29/21 197 lb 6.4 oz (89.5 kg)  ?05/01/21 194 lb 12.8 oz (88.4 kg)  ?03/28/21 200 lb (90.7 kg)  ?  ?BP Readings from Last 3 Encounters:  ?05/29/21 120/78  ?05/01/21 124/82  ?03/28/21 127/69  ?  ?Physical Exam ?Vitals and nursing note reviewed.  ?Constitutional:   ?   General: She is not in acute distress. ?   Appearance: Normal appearance.  ?HENT:  ?   Head: Normocephalic.  ?Eyes:  ?   Conjunctiva/sclera: Conjunctivae normal.  ?Cardiovascular:  ?   Rate and Rhythm: Normal rate and regular rhythm.  ?   Pulses: Normal pulses.  ?   Heart sounds: Normal heart sounds.  ?Pulmonary:  ?   Effort: Pulmonary effort is normal.  ?   Breath sounds: Normal breath sounds.  ?Abdominal:  ?   Tenderness: There is abdominal tenderness in the right lower quadrant. There is no guarding or rebound. Negative signs  include Murphy's sign.  ?Musculoskeletal:  ?   Cervical back: Normal range of motion.  ?Skin: ?   General: Skin is warm.  ?Neurological:  ?   General: No focal deficit present.  ?   Mental Status: She is alert and oriented to person, place, and time.  ?Psychiatric:     ?   Mood and Affect: Mood normal.     ?   Behavior: Behavior normal.     ?   Thought Content: Thought content normal.     ?   Judgment: Judgment normal.  ? ? ?Results for orders placed or performed in visit on 05/01/21  ?CBC with Differential/Platelet  ?Result Value Ref Range  ? WBC 7.1 4.0 - 10.5 K/uL  ? RBC 4.70 3.87 - 5.11 Mil/uL  ? Hemoglobin 13.1 12.0 - 15.0 g/dL  ? HCT 37.9 36.0 - 46.0 %  ? MCV 80.6 78.0 - 100.0 fl  ? MCHC 34.5 30.0 - 36.0 g/dL  ? RDW 14.7 11.5 - 15.5 %  ? Platelets 273.0 150.0 - 400.0 K/uL  ? Neutrophils Relative % 56.8 43.0 - 77.0 %  ? Lymphocytes Relative 26.9  12.0 - 46.0 %  ? Monocytes Relative 7.4 3.0 - 12.0 %  ? Eosinophils Relative 8.3 (H) 0.0 - 5.0 %  ? Basophils Relative 0.6 0.0 - 3.0 %  ? Neutro Abs 4.0 1.4 - 7.7 K/uL  ? Lymphs Abs 1.9 0.7 - 4.0 K/uL  ? Monocytes Absolute 0.5 0.1 - 1.0 K/uL  ? Eosinophils Absolute 0.6 0.0 - 0.7 K/uL  ? Basophils Absolute 0.0 0.0 - 0.1 K/uL  ?Comprehensive metabolic panel  ?Result Value Ref Range  ? Sodium 136 135 - 145 mEq/L  ? Potassium 4.2 3.5 - 5.1 mEq/L  ? Chloride 107 96 - 112 mEq/L  ? CO2 22 19 - 32 mEq/L  ? Glucose, Bld 88 70 - 99 mg/dL  ? BUN 12 6 - 23 mg/dL  ? Creatinine, Ser 0.61 0.40 - 1.20 mg/dL  ? Total Bilirubin 0.6 0.2 - 1.2 mg/dL  ? Alkaline Phosphatase 68 39 - 117 U/L  ? AST 12 0 - 37 U/L  ? ALT 12 0 - 35 U/L  ? Total Protein 7.1 6.0 - 8.3 g/dL  ? Albumin 4.4 3.5 - 5.2 g/dL  ? GFR 111.76 >60.00 mL/min  ? Calcium 8.8 8.4 - 10.5 mg/dL  ?Iron, TIBC and Ferritin Panel  ?Result Value Ref Range  ? Iron 64 40 - 190 mcg/dL  ? TIBC 418 250 - 450 mcg/dL (calc)  ? %SAT 15 (L) 16 - 45 % (calc)  ? Ferritin 12 (L) 16 - 154 ng/mL  ?TSH  ?Result Value Ref Range  ? TSH 2.12 0.35 - 5.50 uIU/mL  ? ?   ?Assessment & Plan:  ? ?Problem List Items Addressed This Visit   ? ?  ? Digestive  ? Rectal bleeding  ?  Rectal bleeding has resolved and bowel movements are more regular since starting Miralax daily. Continue this regimen.  ? ?  ?  ?  ? Other  ? Right lower quadrant abdominal pain - Primary  ?  She is still having ongoing RLQ abdominal pain and tenderness with palpation. She has a transvaginal ultrasound ordered to follow-up on a known right ovarian cyst. Reached out to referral coordinator and was given the number for the patient to call to schedule her CT scan. Discussed ER precautions. Will await results of CT abdomen.  ? ?  ?  ?  ? ?  Follow up plan: ?Return in about 2 months (around 07/29/2021). ?

## 2021-05-29 ENCOUNTER — Ambulatory Visit: Payer: 59 | Admitting: Nurse Practitioner

## 2021-05-29 ENCOUNTER — Encounter: Payer: Self-pay | Admitting: Nurse Practitioner

## 2021-05-29 VITALS — BP 120/78 | HR 67 | Temp 96.9°F | Wt 197.4 lb

## 2021-05-29 DIAGNOSIS — K625 Hemorrhage of anus and rectum: Secondary | ICD-10-CM | POA: Diagnosis not present

## 2021-05-29 DIAGNOSIS — R1031 Right lower quadrant pain: Secondary | ICD-10-CM | POA: Diagnosis not present

## 2021-05-29 NOTE — Assessment & Plan Note (Signed)
She is still having ongoing RLQ abdominal pain and tenderness with palpation. She has a transvaginal ultrasound ordered to follow-up on a known right ovarian cyst. Reached out to referral coordinator and was given the number for the patient to call to schedule her CT scan. Discussed ER precautions. Will await results of CT abdomen.  ?

## 2021-05-29 NOTE — Patient Instructions (Addendum)
Your CT scan was approved at [facility], please call to schedule: ? ?[address] ?[place], [place] [address] ?Phone [phone] ?Fax [phone] ? ?You can call to schedule therapy at: ? ?[phone] ? ?

## 2021-05-29 NOTE — Assessment & Plan Note (Signed)
Rectal bleeding has resolved and bowel movements are more regular since starting Miralax daily. Continue this regimen.  ?

## 2021-07-16 ENCOUNTER — Inpatient Hospital Stay: Admission: RE | Admit: 2021-07-16 | Payer: 59 | Source: Ambulatory Visit

## 2021-07-30 ENCOUNTER — Telehealth: Payer: Self-pay | Admitting: Nurse Practitioner

## 2021-07-30 ENCOUNTER — Ambulatory Visit: Payer: 59 | Admitting: Nurse Practitioner

## 2021-07-30 NOTE — Progress Notes (Deleted)
   Established Patient Office Visit  Subjective   Patient ID: Misty Simpson, female    DOB: July 16, 1980  Age: 41 y.o. MRN: [id number]  No chief complaint on file.   HPI  Misty Simpson is here to follow-up on abdominal pain.   {History (Optional):23778}  ROS    Objective:     There were no vitals taken for this visit. {Vitals History (Optional):23777}  Physical Exam   No results found for any visits on 07/30/21.  {Labs (Optional):23779}  The ASCVD Risk score (Arnett DK, et al., 2019) failed to calculate for the following reasons:   Cannot find a previous HDL lab   Cannot find a previous total cholesterol lab    Assessment & Plan:   Problem List Items Addressed This Visit   None   No follow-ups on file.    [name], NP

## 2021-07-30 NOTE — Telephone Encounter (Signed)
Pt was a no show 07/30/2021 for an OV with Lauren, I have sent out a no show letter

## 2021-08-09 ENCOUNTER — Ambulatory Visit
Admission: RE | Admit: 2021-08-09 | Discharge: 2021-08-09 | Disposition: A | Discharge status: Home / Self Care | Payer: 59 | Source: Ambulatory Visit | Attending: Nurse Practitioner | Admitting: Nurse Practitioner

## 2021-08-09 DIAGNOSIS — R1031 Right lower quadrant pain: Secondary | ICD-10-CM

## 2021-08-09 DIAGNOSIS — M47816 Spondylosis without myelopathy or radiculopathy, lumbar region: Secondary | ICD-10-CM | POA: Diagnosis not present

## 2021-08-09 MED ORDER — IOPAMIDOL (ISOVUE-300) INJECTION 61%
100.0000 mL | Freq: Once | INTRAVENOUS | Status: AC | PRN
  Administered 2021-08-09: 100 mL via INTRAVENOUS

## 2021-08-21 NOTE — Progress Notes (Unsigned)
   Established Patient Office Visit  Subjective   Patient ID: Misty Simpson, female    DOB: 05/15/1980  Age: 41 y.o. MRN: [id number]  No chief complaint on file.   HPI  Damon Su Duma is here to follow-up on RLQ abdominal pain. She had a CT on 08/09/21 which did not show any acute findings.   {History (Optional):23778}  ROS    Objective:     There were no vitals taken for this visit. {Vitals History (Optional):23777}  Physical Exam   No results found for any visits on 08/22/21.  {Labs (Optional):23779}  The ASCVD Risk score (Arnett DK, et al., 2019) failed to calculate for the following reasons:   Cannot find a previous HDL lab   Cannot find a previous total cholesterol lab    Assessment & Plan:   Problem List Items Addressed This Visit   None   No follow-ups on file.    [name], NP

## 2021-08-22 ENCOUNTER — Ambulatory Visit (INDEPENDENT_AMBULATORY_CARE_PROVIDER_SITE_OTHER): Payer: 59 | Admitting: Nurse Practitioner

## 2021-08-22 ENCOUNTER — Encounter: Payer: Self-pay | Admitting: Nurse Practitioner

## 2021-08-22 VITALS — BP 108/68 | HR 57 | Temp 97.2°F | Wt 199.0 lb

## 2021-08-22 DIAGNOSIS — R1031 Right lower quadrant pain: Secondary | ICD-10-CM

## 2021-08-22 DIAGNOSIS — M5441 Lumbago with sciatica, right side: Secondary | ICD-10-CM | POA: Diagnosis not present

## 2021-08-22 DIAGNOSIS — M5442 Lumbago with sciatica, left side: Secondary | ICD-10-CM | POA: Diagnosis not present

## 2021-08-22 DIAGNOSIS — G8929 Other chronic pain: Secondary | ICD-10-CM

## 2021-08-22 MED ORDER — GABAPENTIN 300 MG PO CAPS: 300.0000 mg | ORAL_CAPSULE | Freq: Two times a day (BID) | ORAL | 1 refills | Status: DC

## 2021-08-22 MED ORDER — POLYETHYLENE GLYCOL 3350 17 GM/SCOOP PO POWD
17.0000 g | Freq: Every day | ORAL | 1 refills | Status: DC
Start: 2021-08-22 — End: 2023-11-27

## 2021-08-22 NOTE — Patient Instructions (Signed)
It was great to see you!  I am placing a referral to GI and orthopedics.   I have attached some stretches specifically for your back to do daily.   Start gabapentin 1 capsule at bedtime for 7 days. Then you can increase it to twice a day.   Let's follow-up in 3 months, sooner if you have concerns.  If a referral was placed today, you will be contacted for an appointment. Please note that routine referrals can sometimes take up to 3-4 weeks to process. Please call our office if you haven't heard anything after this time frame.  Take care,  Vance Peper, NP

## 2021-08-22 NOTE — Assessment & Plan Note (Signed)
Chronic, ongoing.  She had a CT scan which was negative.  She does have tenderness to her right lower quadrant.  Discussed limiting foods that trigger the pain.  She can continue MiraLAX daily.  We will place referral to GI for ongoing pain.

## 2021-08-22 NOTE — Assessment & Plan Note (Signed)
Chronic, not controlled.  She has been having bilateral lower back pain for the past 2 to 3 months.  It does radiate down one of her legs intermittently.  TENS machine and Salonpas will help, she can continue this.  We will also have her start stretches to do daily and gabapentin 300 mg at bedtime.  Discussed possible side effects, she can increase this to twice a day after 7 days.  We will also place a referral to orthopedics per her request.  Follow-up in 2 to 3 months.

## 2021-08-24 NOTE — Telephone Encounter (Signed)
1st no show, fee waived, letter sent 

## 2022-02-01 ENCOUNTER — Encounter: Payer: Self-pay | Admitting: Gastroenterology

## 2022-02-01 ENCOUNTER — Other Ambulatory Visit: Payer: Self-pay | Admitting: Nurse Practitioner

## 2022-02-01 DIAGNOSIS — Z1231 Encounter for screening mammogram for malignant neoplasm of breast: Secondary | ICD-10-CM

## 2022-02-08 ENCOUNTER — Ambulatory Visit
Admission: RE | Admit: 2022-02-08 | Discharge: 2022-02-08 | Disposition: A | Discharge status: Home / Self Care | Payer: 59 | Source: Ambulatory Visit

## 2022-02-08 ENCOUNTER — Other Ambulatory Visit: Payer: Self-pay | Admitting: Nurse Practitioner

## 2022-02-08 DIAGNOSIS — Z1231 Encounter for screening mammogram for malignant neoplasm of breast: Secondary | ICD-10-CM

## 2022-02-13 ENCOUNTER — Other Ambulatory Visit (INDEPENDENT_AMBULATORY_CARE_PROVIDER_SITE_OTHER): Payer: 59

## 2022-02-13 ENCOUNTER — Encounter: Payer: Self-pay | Admitting: Gastroenterology

## 2022-02-13 ENCOUNTER — Ambulatory Visit: Payer: 59 | Admitting: Gastroenterology

## 2022-02-13 VITALS — BP 140/90 | HR 89 | Ht 66.0 in | Wt 203.5 lb

## 2022-02-13 DIAGNOSIS — K921 Melena: Secondary | ICD-10-CM

## 2022-02-13 DIAGNOSIS — R1031 Right lower quadrant pain: Secondary | ICD-10-CM | POA: Diagnosis not present

## 2022-02-13 LAB — CBC WITH DIFFERENTIAL/PLATELET
Basophils Absolute: 0.1 10*3/uL (ref 0.0–0.1)
Basophils Relative: 1 % (ref 0.0–3.0)
Eosinophils Absolute: 0.1 10*3/uL (ref 0.0–0.7)
Eosinophils Relative: 1.2 % (ref 0.0–5.0)
HCT: 37.1 % (ref 36.0–46.0)
Hemoglobin: 12.5 g/dL (ref 12.0–15.0)
Lymphocytes Relative: 32.5 % (ref 12.0–46.0)
Lymphs Abs: 1.9 10*3/uL (ref 0.7–4.0)
MCHC: 33.7 g/dL (ref 30.0–36.0)
MCV: 79.2 fl (ref 78.0–100.0)
Monocytes Absolute: 0.5 10*3/uL (ref 0.1–1.0)
Monocytes Relative: 9.1 % (ref 3.0–12.0)
Neutro Abs: 3.3 10*3/uL (ref 1.4–7.7)
Neutrophils Relative %: 56.2 % (ref 43.0–77.0)
Platelets: 305 10*3/uL (ref 150.0–400.0)
RBC: 4.69 Mil/uL (ref 3.87–5.11)
RDW: 14.5 % (ref 11.5–15.5)
WBC: 5.8 10*3/uL (ref 4.0–10.5)

## 2022-02-13 LAB — COMPREHENSIVE METABOLIC PANEL
ALT: 15 U/L (ref 0–35)
AST: 15 U/L (ref 0–37)
Albumin: 4.3 g/dL (ref 3.5–5.2)
Alkaline Phosphatase: 68 U/L (ref 39–117)
BUN: 12 mg/dL (ref 6–23)
CO2: 25 mEq/L (ref 19–32)
Calcium: 8.9 mg/dL (ref 8.4–10.5)
Chloride: 106 mEq/L (ref 96–112)
Creatinine, Ser: 0.63 mg/dL (ref 0.40–1.20)
GFR: 110.28 mL/min (ref >60.00)
Glucose, Bld: 91 mg/dL (ref 70–99)
Potassium: 4.4 mEq/L (ref 3.5–5.1)
Sodium: 137 mEq/L (ref 135–145)
Total Bilirubin: 0.5 mg/dL (ref 0.2–1.2)
Total Protein: 7.6 g/dL (ref 6.0–8.3)

## 2022-02-13 LAB — TSH: TSH: 1.74 u[IU]/mL (ref 0.35–5.50)

## 2022-02-13 MED ORDER — NA SULFATE-K SULFATE-MG SULF 17.5-3.13-1.6 GM/177ML PO SOLN: 1.0000 | Freq: Once | ORAL | 0 refills | Status: AC

## 2022-02-13 NOTE — Patient Instructions (Addendum)
I recommend that you eat at least 25-30 grams of fiber daily and drink at least 64 ounces of water daily. You will want to gradually increase the fiber in your diet to avoid bloating. You may increase the fiber through diet and through fiber supplements including psyllium and methycellulose.   Natural laxatives include prunes, apples, apricots, cherries, peaches, pears, aloe, rhubarb, kiwi, bananas, mango, papaya, and watermelon. In particular, two kiwi a day has been show to cause less likely to cause bloating than prunes or psyllium.  As long as you have healthy kidneys, another options is using magnesium oxide supplements. I recommend starting with 500 mg daily. You could increase the dose to 1000 mg daily after one week if that doesn't seem to be helping.   We discussed a colonoscopy and upper endoscopy. Please call your insurance to find out about how much the procedures might cost.   We discussed a trial of Linzess taken daily if the Miralax if not working.   We discussed a trial of pantoprazole 40 mg every morning for 8 weeks.   We discussed a referral to pelvic floor physical therapy.

## 2022-02-13 NOTE — Progress Notes (Signed)
Referring Provider: [name], NP Primary Care Physician:  [name], NP  Reason for Consultation:  Abdominal pain, bloating, reflux   IMPRESSION:  Constipation and right lower quadrant abdominal pain x 1 year with associated bloating. Not explained by CT.  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 Must consider other organic etiologies, especially given the rectal bleeding.   Rectal bleeding. No rectal pain or itching. Suspected hemorrhoids or fissure. Must consider polyps or mass.   Intermittent reflux. Start pantoprazole. Plan EGD.   Iron deficiency without anemia with history of menorrhagia. Rectal bleeding may be contributing.    Pelvic floor dysfunction diagnosed by GYN   PLAN: CMP, CBC, TSH Pantoprazole 40 mg QAM EGD and colonoscopy with a 2 day bowel prep Continue Miralax Linzess 145 mcg daily Referral for pelvic floor physical therapy Earlie Counts)   HPI: Misty Simpson is a 42 y.o. female referred by NP medical we for further evaluation of right lower quadrant abdominal pain.  The history is obtained through the patient, review of her electronic health record, and her boyfriend, Joneen Boers. 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   Chronic constipation, abdominal pain and associated bloating. She has occasional bleeding that she attributed to hemorrhoid. There is associated rectal pain described as cut glass. There is associated straining and a sense of incomplete evacuation. Stools are hard and she will frequently go several days between bowel movements. The symptoms are so severe she has been unable to go to the gym and exercise. After episodes of constipation, she will have loose and almost watery stools.   She tries to eat foods that may improve her constipation - especially beets.   Pain is exacerbated by white bread, white rice, pasta and jalapeno. Use of MiraLAX has helped with constipation and some of  the pain. Detox teas are used on her days.  Preparation H wipes can help with the hemorrhoids.   A CT of the abdomen and pelvis on 08/09/2021 did not show any acute findings. Evaluation by GYN on October did not reveal a source.  She was referred to pelvic floor physical therapy for laboratory tenderness with pelvic floor dysfunction.  Labs in April 2023 showing normal CBC with differential except for relative eosinophils of 8.3.  CMP was normal.  Iron 64, ferritin 12, percent saturation 15. She has been taking iron pills but stopped due to the worsening of her constipation.   Mother with skin cancer. Had stomach issues. Oldest brother had stomach pain that required a trip to [place] for treatment. There is no known family history of colon cancer or polyps. No family history of stomach cancer or other GI malignancy. No family history of inflammatory bowel disease or celiac.    Past Medical History:  Diagnosis Date   History of blood transfusion    Kidney stones    Ovarian cyst     History reviewed. No pertinent surgical history.    Current Outpatient Medications  Medication Sig Dispense Refill   Multiple Vitamin (MULTIVITAMIN) capsule Take 1 capsule by mouth daily.     Na Sulfate-K Sulfate-Mg Sulf 17.5-3.13-1.6 GM/177ML SOLN Take 1 kit by mouth once for 1 dose. 354 mL 0   polyethylene glycol powder (GLYCOLAX/MIRALAX) 17 GM/SCOOP powder Take 17 g by mouth daily. 3350 g 1   No current facility-administered medications for this visit.    Allergies as of 02/13/2022   (No Known Allergies)    Family History  Problem Relation Age  of Onset   Cancer Mother        skin   Heart disease Father     Social History   Socioeconomic History   Marital status: Single    Spouse name: Not on file   Number of children: Not on file   Years of education: Not on file   Highest education level: Not on file  Occupational History   Not on file  Tobacco Use   Smoking status: Never    Smokeless tobacco: Never  Vaping Use   Vaping Use: Never used  Substance and Sexual Activity   Alcohol use: Yes    Comment: social   Drug use: No   Sexual activity: Yes    Birth control/protection: Condom  Other Topics Concern   Not on file  Social History Narrative   Not on file   Social Determinants of Health   Financial Resource Strain: Not on file  Food Insecurity: Not on file  Transportation Needs: Not on file  Physical Activity: Not on file  Stress: Not on file  Social Connections: Not on file  Intimate Partner Violence: Not on file    Review of Systems: 12 system ROS is negative except as noted above with the addition of lower back pain.   Physical Exam: General:   Alert,  well-nourished, pleasant and cooperative in NAD Head:  Normocephalic and atraumatic. Eyes:  Sclera clear, no icterus.   Conjunctiva pink. Ears:  Normal auditory acuity. Nose:  No deformity, discharge,  or lesions. Mouth:  No deformity or lesions.   Neck:  Supple; no masses or thyromegaly. Lungs:  Clear throughout to auscultation.   No wheezes. Heart:  Regular rate and rhythm; no murmurs. Abdomen:  Soft, nontender, nondistended, normal bowel sounds, no rebound or guarding. No hepatosplenomegaly.   Rectal:  Deferred  to colonoscopy Msk:  Symmetrical. No boney deformities LAD: No inguinal or umbilical LAD Extremities:  No clubbing or edema. Neurologic:  Alert and  oriented x4;  grossly nonfocal Skin:  Intact without significant lesions or rashes. Psych:  Alert and cooperative. Normal mood and affect.   Natayah Warmack L. Tarri Glenn, MD, MPH 02/13/2022, 11:13 AM

## 2022-02-26 ENCOUNTER — Encounter: Payer: Self-pay | Admitting: Gastroenterology

## 2022-03-21 NOTE — Progress Notes (Signed)
ANNUAL EXAM Patient name: Misty Simpson MRN [id number]  Date of birth: August 21, 1980 Chief Complaint:   Annual Exam  History of Present Illness:   Misty Simpson is a 42 y.o. G66P1001 female being seen today for a routine annual exam.   Current complaints: Has mood fluctuations. Feels like mind races at night. Notes low libido. Works long days 5 days a week. Also notes weight gain.   Patient's last menstrual period was 03/22/2022 (exact date).  Current birth control: None  Has h/o menorrhagia requiring blood transfusion. Historically: 01/2018: Normal [place] except 2 cm subserosal fibroid VWD w/u negative 01/2019 01/2019: pap wnl, HPV negative 01/2022: CBC wnl -- HgB was 12.5 01/2022: TSH wnl  Last pap: 01/2019. Results were: NILM w/ HRHPV negative. H/O abnormal pap: no Last MXR: 02/11/2022  Health Maintenance Due  Topic Date Due   DTaP/Tdap/Td (1 - Tdap) Never done   INFLUENZA VACCINE  08/21/2021   COVID-19 Vaccine (3 - 2023-24 season) 09/21/2021   PAP SMEAR-Modifier  02/18/2022    Review of Systems:   Pertinent items are noted in HPI Denies any headaches, blurred vision, fatigue, shortness of breath, chest pain, abdominal pain, abnormal vaginal discharge/itching/odor/irritation, problems with periods, bowel movements, urination, or intercourse unless otherwise stated above.  Pertinent History Reviewed:  Reviewed past medical,surgical, social and family history.  Reviewed problem list, medications and allergies. Physical Assessment:   Vitals:   03/27/22 0937 03/27/22 0941  BP: (!) 142/78 128/73  Pulse: 63 61  Weight: 200 lb 14.4 oz (91.1 kg)   Height: '5\' 6"'$  (1.676 m)   Body mass index is 32.43 kg/m.   Physical Examination:  General appearance - well appearing, and in no distress Mental status - alert, oriented to person, place, and time Psych:  She has a normal mood and affect Skin - warm and dry, normal color, no suspicious lesions noted Chest - effort  normal Heart - normal rate  Breasts - breasts appear normal, no suspicious masses, no skin or nipple changes or axillary nodes Abdomen - soft, nontender, nondistended, no masses or organomegaly Pelvic -  VULVA: normal appearing vulva with no masses, tenderness or lesions  VAGINA: normal appearing vagina with normal color and discharge, no lesions  CERVIX: normal appearing cervix without discharge or lesions, no CMT UTERUS: uterus is felt to be normal size, shape, consistency and nontender  ADNEXA: No adnexal masses or tenderness noted. Extremities:  No swelling or varicosities noted  Chaperone present for exam  No results found for this or any previous visit (from the past 24 hour(s)).  Assessment & Plan:  Misty Simpson was seen today for annual exam.  Diagnoses and all orders for this visit:  Encounter for annual routine gynecological examination - Cervical cancer screening: Discussed guidelines. Pap with HPV done - STD Testing: accepts - Birth Control: None - Breast Health: Encouraged self breast awareness/SBE. Teaching provided. Discussed limits of clinical breast exam for detecting breast cancer. MXR is up to date: 01/2022 - F/U 12 months and prn -     Cytology - PAP -     Ambulatory referral to [place] -     busPIRone (BUSPAR) 5 MG tablet; Take 1 tablet (5 mg total) by mouth 2 (two) times daily.  Mood swings Discussed buspar may help with both generalized anxiety, bring her down a level and also may improve libido. By improving mood, sleep, etc she may notice improvement in weight gain/loss. Discussed can taper the buspar to up to  15 mg tid, but would start at BID. If this doesn't work or is insufficient, would also consider wellbutrin.  -     Ambulatory referral to [facility] -     busPIRone (BUSPAR) 5 MG tablet; Take 1 tablet (5 mg total) by mouth 2 (two) times daily.    Orders Placed This Encounter  Procedures   Ambulatory referral to  [facility]:  Meds ordered this encounter  Medications   busPIRone (BUSPAR) 5 MG tablet    Sig: Take 1 tablet (5 mg total) by mouth 2 (two) times daily.    Dispense:  60 tablet    Refill:  12    Follow-up: No follow-ups on file.  Radene Gunning, MD 03/27/2022 11:56 AM

## 2022-03-22 ENCOUNTER — Ambulatory Visit (AMBULATORY_SURGERY_CENTER): Payer: 59 | Admitting: Gastroenterology

## 2022-03-22 ENCOUNTER — Encounter: Payer: Self-pay | Admitting: Gastroenterology

## 2022-03-22 VITALS — BP 120/69 | HR 73 | Temp 97.1°F | Resp 16 | Ht 66.0 in | Wt 203.0 lb

## 2022-03-22 DIAGNOSIS — K3189 Other diseases of stomach and duodenum: Secondary | ICD-10-CM

## 2022-03-22 DIAGNOSIS — K921 Melena: Secondary | ICD-10-CM

## 2022-03-22 DIAGNOSIS — D124 Benign neoplasm of descending colon: Secondary | ICD-10-CM | POA: Diagnosis not present

## 2022-03-22 DIAGNOSIS — K2951 Unspecified chronic gastritis with bleeding: Secondary | ICD-10-CM | POA: Diagnosis not present

## 2022-03-22 DIAGNOSIS — K2981 Duodenitis with bleeding: Secondary | ICD-10-CM | POA: Diagnosis not present

## 2022-03-22 DIAGNOSIS — K209 Esophagitis, unspecified without bleeding: Secondary | ICD-10-CM | POA: Diagnosis not present

## 2022-03-22 DIAGNOSIS — K2900 Acute gastritis without bleeding: Secondary | ICD-10-CM

## 2022-03-22 DIAGNOSIS — K2101 Gastro-esophageal reflux disease with esophagitis, with bleeding: Secondary | ICD-10-CM | POA: Diagnosis not present

## 2022-03-22 DIAGNOSIS — R1031 Right lower quadrant pain: Secondary | ICD-10-CM | POA: Diagnosis not present

## 2022-03-22 DIAGNOSIS — K635 Polyp of colon: Secondary | ICD-10-CM | POA: Diagnosis not present

## 2022-03-22 DIAGNOSIS — K2901 Acute gastritis with bleeding: Secondary | ICD-10-CM | POA: Diagnosis not present

## 2022-03-22 MED ORDER — SODIUM CHLORIDE 0.9 % IV SOLN: 500.0000 mL | INTRAVENOUS | Status: DC

## 2022-03-22 MED ORDER — PANTOPRAZOLE SODIUM 40 MG PO TBEC: 40.0000 mg | DELAYED_RELEASE_TABLET | Freq: Two times a day (BID) | ORAL | 2 refills | Status: DC

## 2022-03-22 NOTE — Op Note (Signed)
[place] [facility] Patient Name: Misty Simpson Procedure Date: 03/22/2022 2:12 PM MRN: [id number] Endoscopist: Thornton Park MD, MD, [id number] Age: 42 Referring MD:  Date of Birth: 06-27-1980 Gender: Female Account #: [id number] Procedure:                Colonoscopy Indications:              Abdominal pain Medicines:                Monitored Anesthesia Care Procedure:                Pre-Anesthesia Assessment:                           - Prior to the procedure, a History and Physical                            was performed, and patient medications and                            allergies were reviewed. The patient's tolerance of                            previous anesthesia was also reviewed. The risks                            and benefits of the procedure and the sedation                            options and risks were discussed with the patient.                            All questions were answered, and informed consent                            was obtained. Prior Anticoagulants: The patient has                            taken no anticoagulant or antiplatelet agents. ASA                            Grade Assessment: I - A normal, healthy patient.                            After reviewing the risks and benefits, the patient                            was deemed in satisfactory condition to undergo the                            procedure.                           After obtaining informed consent, the colonoscope  was passed under direct vision. Throughout the                            procedure, the patient's blood pressure, pulse, and                            oxygen saturations were monitored continuously. The                            Olympus PCF-H190DL [id number]) Colonoscope was                            introduced through the anus and advanced to the 5                            cm into the ileum. The colonoscopy was performed                             without difficulty. The patient tolerated the                            procedure well. The quality of the bowel                            preparation was good. The terminal ileum, ileocecal                            valve, appendiceal orifice, and rectum were                            photographed. Scope In: 2:44:37 PM Scope Out: 2:59:13 PM Scope Withdrawal Time: 0 hours 11 minutes 13 seconds  Total Procedure Duration: 0 hours 14 minutes 36 seconds  Findings:                 The perianal and digital rectal examinations were                            normal.                           A 1 mm polyp was found in the descending colon. The                            polyp was sessile. The polyp was removed with a                            cold snare. Resection and retrieval were complete.                            Estimated blood loss was minimal.                           The exam was otherwise without abnormality on  direct and retroflexion views except for small                            internal hemorrhoids. Complications:            No immediate complications. Estimated Blood Loss:     Estimated blood loss was minimal. Impression:               - One 1 mm polyp in the descending colon, removed                            with a cold snare. Resected and retrieved.                           - The examination was otherwise normal on direct                            and retroflexion views. Recommendation:           - Patient has a contact number available for                            emergencies. The signs and symptoms of potential                            delayed complications were discussed with the                            patient. Return to normal activities tomorrow.                            Written discharge instructions were provided to the                            patient.                           - Resume previous  diet.                           - Continue present medications.                           - Await pathology results.                           - Repeat colonoscopy date to be determined after                            pending pathology results are reviewed for                            surveillance.                           - Emerging evidence supports eating a diet of  fruits, vegetables, grains, calcium, and yogurt                            while reducing red meat and alcohol may reduce the                            risk of colon cancer.                           - Thank you for allowing me to be involved in your                            colon cancer prevention. Thornton Park MD, MD 03/22/2022 3:09:04 PM This report has been signed electronically.

## 2022-03-22 NOTE — Patient Instructions (Signed)
Please read handouts provided. Continue present medications. Pantoprazole 40 mg twice daily for 10 weeks. Await pathology results. No aspirin, ibuprofen, naproxen, or other non-steriodal anti-inflammatory drugs.  YOU HAD AN ENDOSCOPIC PROCEDURE TODAY AT [facility]:   Refer to the procedure report that was given to you for any specific questions about what was found during the examination.  If the procedure report does not answer your questions, please call your gastroenterologist to clarify.  If you requested that your care partner not be given the details of your procedure findings, then the procedure report has been included in a sealed envelope for you to review at your convenience later.  YOU SHOULD EXPECT: Some feelings of bloating in the abdomen. Passage of more gas than usual.  Walking can help get rid of the air that was put into your GI tract during the procedure and reduce the bloating. If you had a lower endoscopy (such as a colonoscopy or flexible sigmoidoscopy) you may notice spotting of blood in your stool or on the toilet paper. If you underwent a bowel prep for your procedure, you may not have a normal bowel movement for a few days.  Please Note:  You might notice some irritation and congestion in your nose or some drainage.  This is from the oxygen used during your procedure.  There is no need for concern and it should clear up in a day or so.  SYMPTOMS TO REPORT IMMEDIATELY:  Following lower endoscopy (colonoscopy or flexible sigmoidoscopy):  Excessive amounts of blood in the stool  Significant tenderness or worsening of abdominal pains  Swelling of the abdomen that is new, acute  Fever of 100F or higher  Following upper endoscopy (EGD)  Vomiting of blood or coffee ground material  New chest pain or pain under the shoulder blades  Painful or persistently difficult swallowing  New shortness of breath  Fever of 100F or higher  Black, tarry-looking  stools  For urgent or emergent issues, a gastroenterologist can be reached at any hour by calling [phone]. Do not use MyChart messaging for urgent concerns.    DIET:  We do recommend a small meal at first, but then you may proceed to your regular diet.  Drink plenty of fluids but you should avoid alcoholic beverages for 24 hours.  ACTIVITY:  You should plan to take it easy for the rest of today and you should NOT DRIVE or use heavy machinery until tomorrow (because of the sedation medicines used during the test).    FOLLOW UP: Our staff will call the number listed on your records the next business day following your procedure.  We will call around 7:15- 8:00 am to check on you and address any questions or concerns that you may have regarding the information given to you following your procedure. If we do not reach you, we will leave a message.     If any biopsies were taken you will be contacted by phone or by letter within the next 1-3 weeks.  Please call us at [phone] if you have not heard about the biopsies in 3 weeks.    SIGNATURES/CONFIDENTIALITY: You and/or your care partner have signed paperwork which will be entered into your electronic medical record.  These signatures attest to the fact that that the information above on your After Visit Summary has been reviewed and is understood.  Full responsibility of the confidentiality of this discharge information lies with you and/or your care-partner.

## 2022-03-22 NOTE — Progress Notes (Signed)
Pt's states no medical or surgical changes since previsit or office visit. 

## 2022-03-22 NOTE — Progress Notes (Signed)
Report to pacu rn. Vss. Care resumed by rn. 

## 2022-03-22 NOTE — Progress Notes (Signed)
Called to room to assist during endoscopic procedure.  Patient ID and intended procedure confirmed with present staff. Received instructions for my participation in the procedure from the performing physician.  

## 2022-03-22 NOTE — Progress Notes (Signed)
During upper endoscopy, patient coughing and O2 desaturating. MD paused procedure during desaturation. O2 via nasal canula turned up. Jaw thrust performed. Face mask applied. O2 level increased, but slowly. No adverse events noted.

## 2022-03-22 NOTE — Progress Notes (Signed)
Referring Provider: [name], NP Primary Care Physician:  [name], NP   Indication for EGD: Abdominal pain, bloating, iron deficiency anemia Indication for Colonoscopy: Abdominal pain, bloating, rectal bleeding  IMPRESSION:  Abdominal pain Bloating Rectal bleeding Iron deficiency anemia Appropriate candidate for monitored anesthesia care  PLAN: EGD and colonoscopy in the [place] today   HPI: Misty Simpson is a 42 y.o. female presents for s endoscopic evaluation.  Chronic constipation, abdominal pain and associated bloating. She has occasional bleeding that she attributed to hemorrhoid. There is associated rectal pain described as cut glass. There is associated straining and a sense of incomplete evacuation. Stools are hard and she will frequently go several days between bowel movements. The symptoms are so severe she has been unable to go to the gym and exercise. After episodes of constipation, she will have loose and almost watery stools.    She tries to eat foods that may improve her constipation - especially beets.    Pain is exacerbated by white bread, white rice, pasta and jalapeno. Use of MiraLAX has helped with constipation and some of the pain. Detox teas are used on her days.  Preparation H wipes can help with the hemorrhoids.    A CT of the abdomen and pelvis on 08/09/2021 did not show any acute findings. Evaluation by GYN on October did not reveal a source.  She was referred to pelvic floor physical therapy for laboratory tenderness with pelvic floor dysfunction.   Labs in April 2023 showing normal CBC with differential except for relative eosinophils of 8.3.  CMP was normal.  Iron 64, ferritin 12, percent saturation 15. She has been taking iron pills but stopped due to the worsening of her constipation.    Mother with skin cancer. Had stomach issues. Oldest brother had stomach pain that required a trip to [place] for treatment. There is no known  family history of colon cancer or polyps. No family history of stomach cancer or other GI malignancy. No family history of inflammatory bowel disease or celiac.    Past Medical History:  Diagnosis Date   History of blood transfusion    Kidney stones    Ovarian cyst     History reviewed. No pertinent surgical history.  Current Outpatient Medications  Medication Sig Dispense Refill   Multiple Vitamin (MULTIVITAMIN) capsule Take 1 capsule by mouth daily.     polyethylene glycol powder (GLYCOLAX/MIRALAX) 17 GM/SCOOP powder Take 17 g by mouth daily. 3350 g 1   Current Facility-Administered Medications  Medication Dose Route Frequency Provider Last Rate Last Admin   0.9 %  sodium chloride infusion  500 mL Intravenous Continuous Thornton Park, MD        Allergies as of 03/22/2022   (No Known Allergies)    Family History  Problem Relation Age of Onset   Cancer Mother        skin   Heart disease Father    Colon cancer Neg Hx    Colon polyps Neg Hx    Esophageal cancer Neg Hx    Rectal cancer Neg Hx    Stomach cancer Neg Hx      Physical Exam: General:   Alert,  well-nourished, pleasant and cooperative in NAD Head:  Normocephalic and atraumatic. Eyes:  Sclera clear, no icterus.   Conjunctiva pink. Mouth:  No deformity or lesions.   Neck:  Supple; no masses or thyromegaly. Lungs:  Clear throughout to auscultation.   No wheezes. Heart:  Regular  rate and rhythm; no murmurs. Abdomen:  Soft, non-tender, nondistended, normal bowel sounds, no rebound or guarding.  Msk:  Symmetrical. No boney deformities LAD: No inguinal or umbilical LAD Extremities:  No clubbing or edema. Neurologic:  Alert and  oriented x4;  grossly nonfocal Skin:  No obvious rash or bruise. Psych:  Alert and cooperative. Normal mood and affect.     Studies/Results: No results found.    Kayleen Alig L. Tarri Glenn, MD, MPH 03/22/2022, 2:12 PM

## 2022-03-22 NOTE — Op Note (Signed)
[facility] Patient Name: Misty Simpson Procedure Date: 03/22/2022 2:13 PM MRN: [id number] Endoscopist: Thornton Park MD, MD, [id number] Age: 42 Referring MD:  Date of Birth: [birth year] Gender: Female Account #: [id number] Procedure:                Upper GI endoscopy Indications:              Abdominal pain Medicines:                Monitored Anesthesia Care Procedure:                Pre-Anesthesia Assessment:                           - Prior to the procedure, a History and Physical                            was performed, and patient medications and                            allergies were reviewed. The patient's tolerance of                            previous anesthesia was also reviewed. The risks                            and benefits of the procedure and the sedation                            options and risks were discussed with the patient.                            All questions were answered, and informed consent                            was obtained. Prior Anticoagulants: The patient has                            taken no anticoagulant or antiplatelet agents. ASA                            Grade Assessment: I - A normal, healthy patient.                            After reviewing the risks and benefits, the patient                            was deemed in satisfactory condition to undergo the                            procedure.                           After obtaining informed consent, the endoscope was  passed under direct vision. Throughout the                            procedure, the patient's blood pressure, pulse, and                            oxygen saturations were monitored continuously. The                            GIF [id number] was introduced through the                            mouth, and advanced to the second part of duodenum.                            The upper GI endoscopy was unusually difficult  due                            to the patient's oxygen desaturation. Successful                            completion of the procedure was aided by performing                            chin lift and administering oxygen. The patient                            tolerated the procedure well. Scope In: Scope Out: Findings:                 LA Grade A (one or more mucosal breaks less than 5                            mm, not extending between tops of 2 mucosal folds)                            esophagitis with no bleeding was found. Biopsies                            were taken from the distal esophagus with a cold                            forceps for histology. Estimated blood loss was                            minimal.                           A single nodule with no bleeding and no stigmata of                            recent bleeding was found in the cardia just distal  to the GE junction. The nodule was Paris                            classification Is (protruding, sessile). Biopsies                            were taken with a cold forceps for histology.                            Estimated blood loss was minimal.                           Patchy mildly erythematous mucosa without bleeding                            was found in the gastric antrum. Biopsies were                            taken from the antrum, body, and fundus with a cold                            forceps for histology. Estimated blood loss was                            minimal.                           The examined duodenum was normal. Biopsies were                            taken with a cold forceps for histology. Estimated                            blood loss was minimal. Complications:            No immediate complications. Estimated Blood Loss:     Estimated blood loss was minimal. Impression:               - LA Grade A esophagitis with no bleeding. Biopsied.                            - A single nodule found in the cardia. Biopsied.                           - Erythematous mucosa in the antrum. Biopsied.                           - Normal examined duodenum. Biopsied. Recommendation:           - Patient has a contact number available for                            emergencies. The signs and symptoms of potential  delayed complications were discussed with the                            patient. Return to normal activities tomorrow.                            Written discharge instructions were provided to the                            patient.                           - Resume previous diet.                           - Continue present medications.                           - Start pantoprazole 40 mg BID x 10 weeks.                           - No aspirin, ibuprofen, naproxen, or other                            non-steroidal anti-inflammatory drugs.                           - Consider repeat EGD after completing treatment to                            monitor the gastric antral nodule.                           - Await pathology results. Thornton Park MD, MD 03/22/2022 3:06:39 PM This report has been signed electronically.

## 2022-03-25 ENCOUNTER — Telehealth: Payer: Self-pay | Admitting: *Deleted

## 2022-03-25 NOTE — Telephone Encounter (Signed)
Follow up call attempt.  LVM to call if any questions or concerns. 

## 2022-03-27 ENCOUNTER — Encounter: Payer: Self-pay | Admitting: Obstetrics and Gynecology

## 2022-03-27 ENCOUNTER — Other Ambulatory Visit (HOSPITAL_COMMUNITY)
Admission: RE | Admit: 2022-03-27 | Discharge: 2022-03-27 | Disposition: A | Discharge status: Home / Self Care | Payer: 59 | Source: Ambulatory Visit | Attending: Obstetrics and Gynecology | Admitting: Obstetrics and Gynecology

## 2022-03-27 ENCOUNTER — Ambulatory Visit (INDEPENDENT_AMBULATORY_CARE_PROVIDER_SITE_OTHER): Payer: 59 | Admitting: Obstetrics and Gynecology

## 2022-03-27 VITALS — BP 128/73 | HR 61 | Ht 66.0 in | Wt 200.9 lb

## 2022-03-27 DIAGNOSIS — Z01419 Encounter for gynecological examination (general) (routine) without abnormal findings: Secondary | ICD-10-CM | POA: Diagnosis not present

## 2022-03-27 DIAGNOSIS — R4586 Emotional lability: Secondary | ICD-10-CM

## 2022-03-27 MED ORDER — BUSPIRONE HCL 5 MG PO TABS
5.0000 mg | ORAL_TABLET | Freq: Two times a day (BID) | ORAL | 12 refills | Status: DC
Start: 2022-03-27 — End: 2022-10-04

## 2022-03-29 LAB — CYTOLOGY - PAP
Chlamydia: NEGATIVE
Comment: NEGATIVE
Comment: NEGATIVE
Comment: NORMAL
Diagnosis: NEGATIVE
High risk HPV: NEGATIVE
Neisseria Gonorrhea: NEGATIVE

## 2022-05-18 ENCOUNTER — Encounter: Payer: Self-pay | Admitting: Gastroenterology

## 2022-05-18 NOTE — Progress Notes (Signed)
Error

## 2022-08-08 ENCOUNTER — Ambulatory Visit: Payer: 59 | Admitting: Gastroenterology

## 2022-08-22 IMAGING — MG MM DIGITAL SCREENING BILAT W/ TOMO AND CAD
8 series · 8 of 24 positions shown · non-contrast
Comparison: Previous exam(s).

CLINICAL DATA: Screening.

EXAM:
DIGITAL SCREENING BILATERAL MAMMOGRAM WITH TOMOSYNTHESIS AND CAD
TECHNIQUE: Bilateral screening digital craniocaudal and mediolateral oblique
mammograms were obtained. Bilateral screening digital breast
tomosynthesis was performed. The images were evaluated with
computer-aided detection.

[R CC synth-2D]
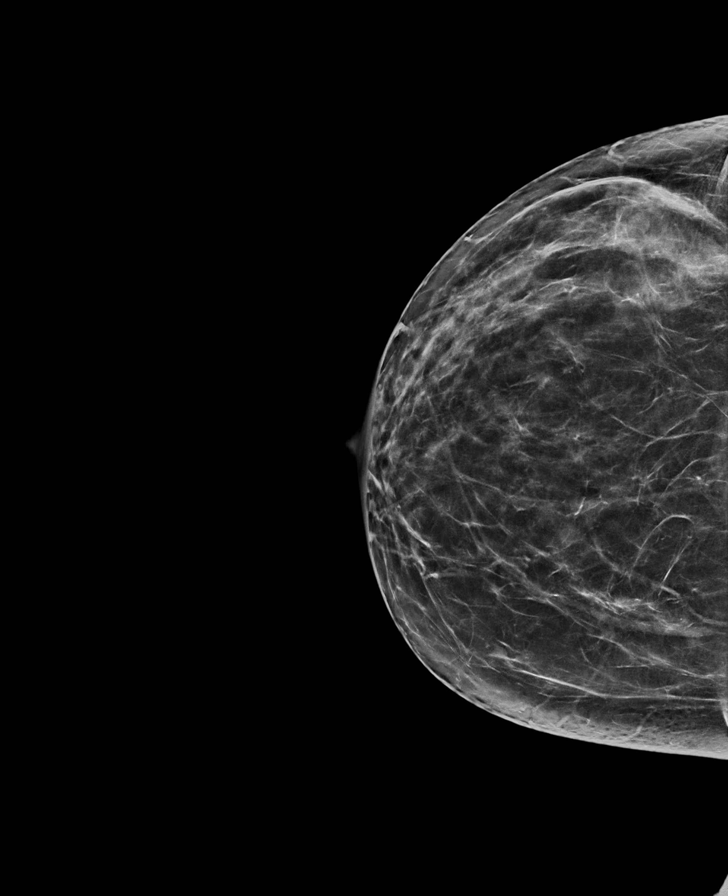

[L CC synth-2D]
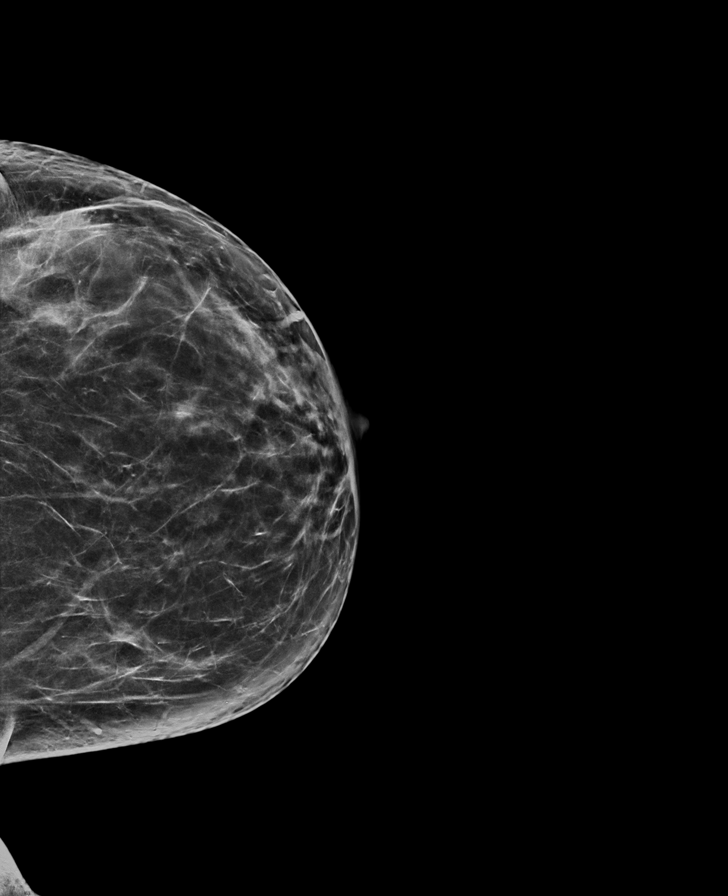

[L MLO synth-2D]
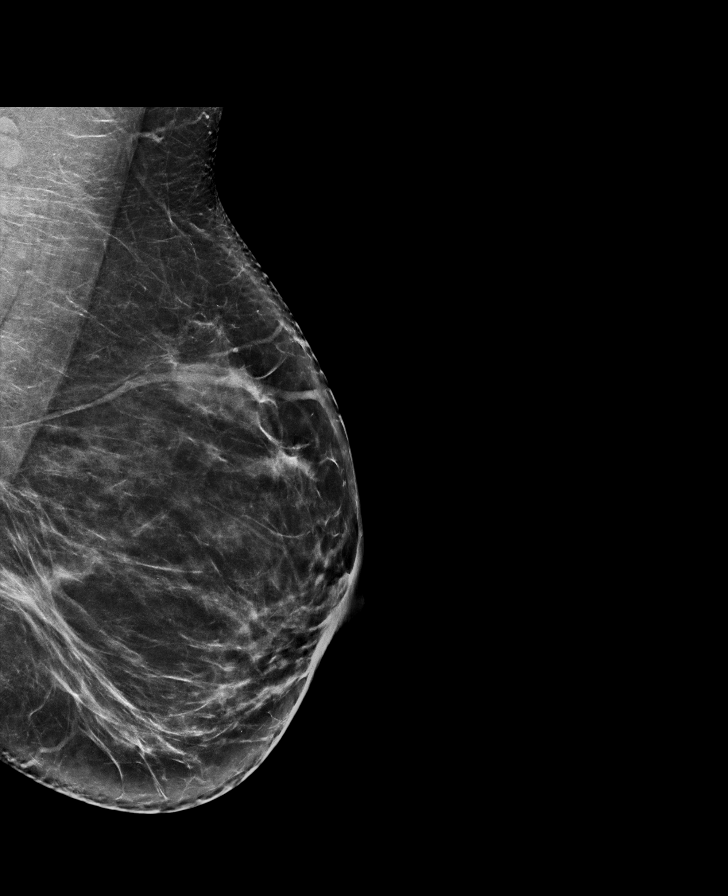

[R MLO synth-2D]
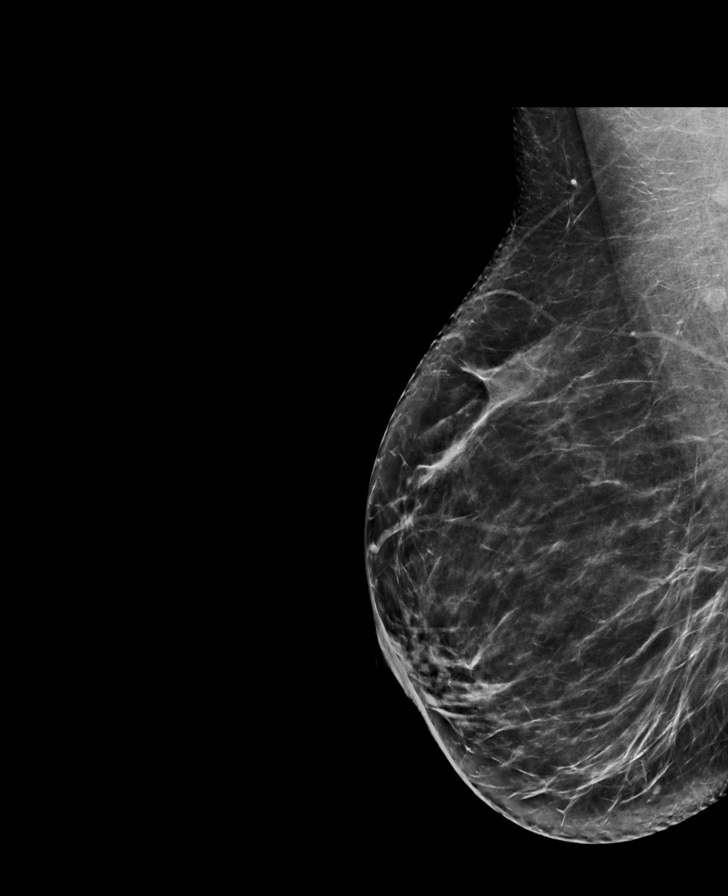

[L CC tomo · tomo slice 37/72.0]
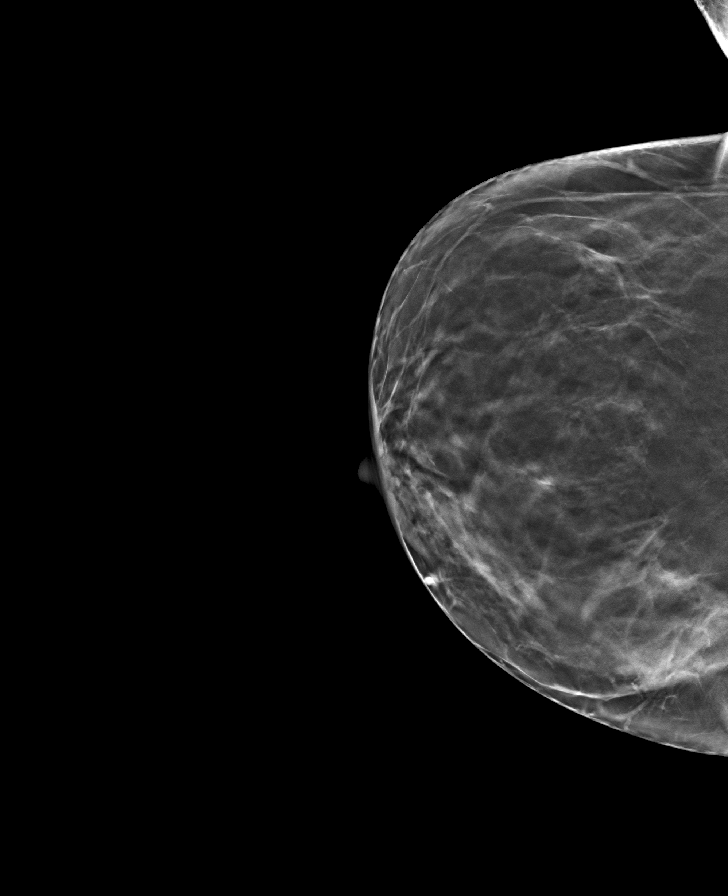

[R MLO tomo · tomo slice 41/80.0]
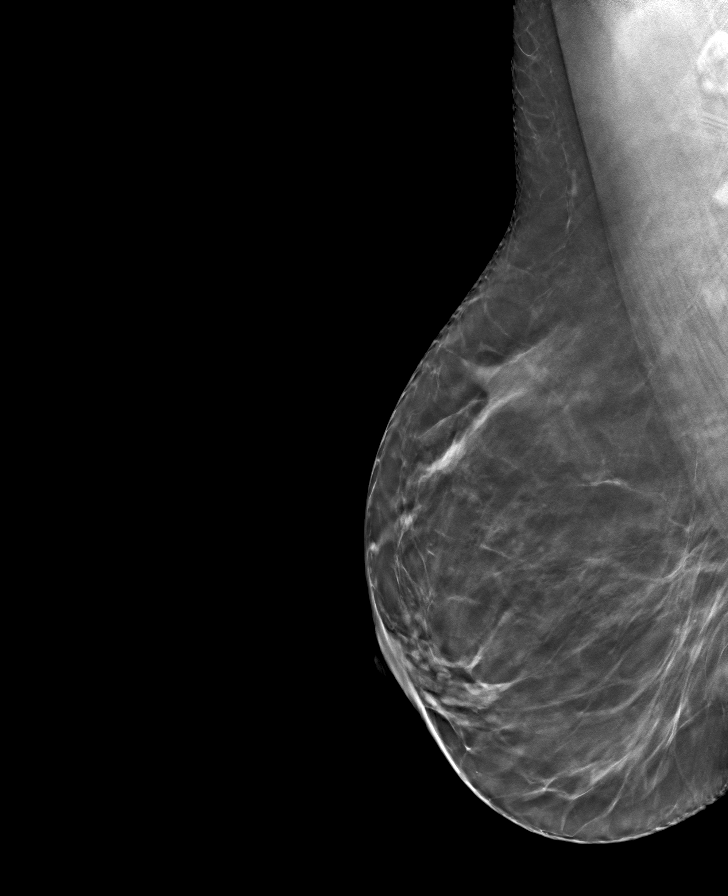

[R CC tomo · tomo slice 35/70.0]
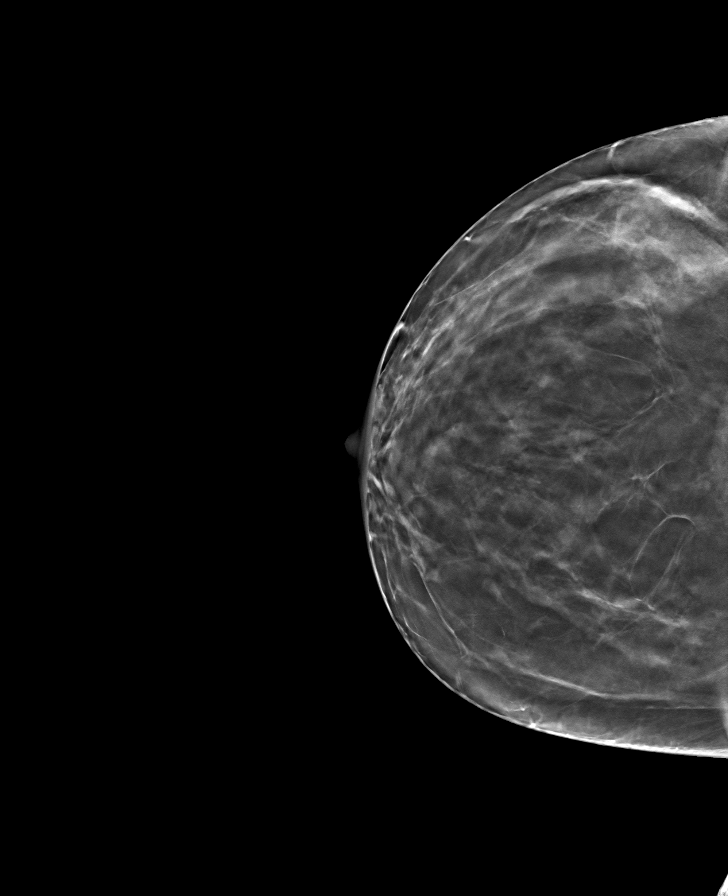

[L MLO tomo · tomo slice 41/82.0]
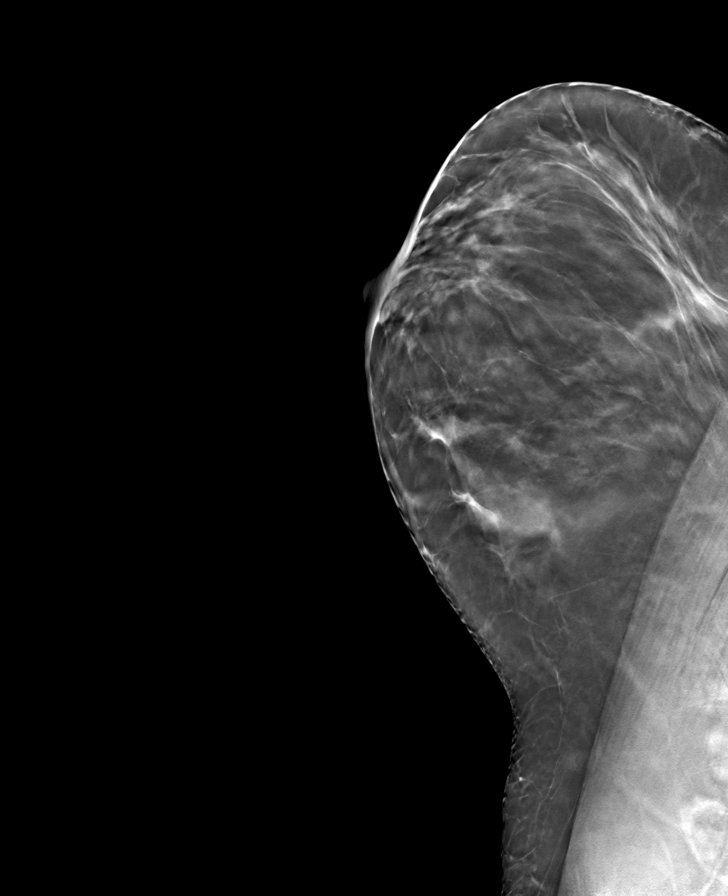

[8 of 24 positions shown; findings below may reference images not displayed]

ACR Breast Density Category c: The breast tissue is heterogeneously
dense, which may obscure small masses.
FINDINGS: There are no findings suspicious for malignancy.
IMPRESSION: No mammographic evidence of malignancy. A result letter of this
screening mammogram will be mailed directly to the patient.

RECOMMENDATION:
Screening mammogram in one year. (Code:Q3-W-BC3)

BI-RADS CATEGORY  1: Negative.

## 2022-10-04 ENCOUNTER — Encounter: Payer: Self-pay | Admitting: Nurse Practitioner

## 2022-10-04 ENCOUNTER — Ambulatory Visit (INDEPENDENT_AMBULATORY_CARE_PROVIDER_SITE_OTHER): Payer: 59 | Admitting: Nurse Practitioner

## 2022-10-04 VITALS — BP 124/80 | HR 70 | Temp 97.8°F | Wt 203.4 lb

## 2022-10-04 DIAGNOSIS — Z0000 Encounter for general adult medical examination without abnormal findings: Secondary | ICD-10-CM | POA: Diagnosis not present

## 2022-10-04 DIAGNOSIS — K5909 Other constipation: Secondary | ICD-10-CM | POA: Diagnosis not present

## 2022-10-04 DIAGNOSIS — Z136 Encounter for screening for cardiovascular disorders: Secondary | ICD-10-CM

## 2022-10-04 DIAGNOSIS — K2100 Gastro-esophageal reflux disease with esophagitis, without bleeding: Secondary | ICD-10-CM | POA: Diagnosis not present

## 2022-10-04 DIAGNOSIS — F419 Anxiety disorder, unspecified: Secondary | ICD-10-CM | POA: Diagnosis not present

## 2022-10-04 DIAGNOSIS — Z23 Encounter for immunization: Secondary | ICD-10-CM | POA: Diagnosis not present

## 2022-10-04 LAB — CBC WITH DIFFERENTIAL/PLATELET
Basophils Absolute: 0 10*3/uL (ref 0.0–0.1)
Basophils Relative: 0.7 % (ref 0.0–3.0)
Eosinophils Absolute: 0.2 10*3/uL (ref 0.0–0.7)
Eosinophils Relative: 2.7 % (ref 0.0–5.0)
HCT: 35.5 % — ABNORMAL LOW (ref 36.0–46.0)
Hemoglobin: 11.7 g/dL — ABNORMAL LOW (ref 12.0–15.0)
Lymphocytes Relative: 30.4 % (ref 12.0–46.0)
Lymphs Abs: 2.1 10*3/uL (ref 0.7–4.0)
MCHC: 32.9 g/dL (ref 30.0–36.0)
MCV: 76.9 fl — ABNORMAL LOW (ref 78.0–100.0)
Monocytes Absolute: 0.8 10*3/uL (ref 0.1–1.0)
Monocytes Relative: 10.8 % (ref 3.0–12.0)
Neutro Abs: 3.9 10*3/uL (ref 1.4–7.7)
Neutrophils Relative %: 55.4 % (ref 43.0–77.0)
Platelets: 319 10*3/uL (ref 150.0–400.0)
RBC: 4.62 Mil/uL (ref 3.87–5.11)
RDW: 15.8 % — ABNORMAL HIGH (ref 11.5–15.5)
WBC: 7 10*3/uL (ref 4.0–10.5)

## 2022-10-04 LAB — COMPREHENSIVE METABOLIC PANEL
ALT: 16 U/L (ref 0–35)
AST: 14 U/L (ref 0–37)
Albumin: 4 g/dL (ref 3.5–5.2)
Alkaline Phosphatase: 78 U/L (ref 39–117)
BUN: 15 mg/dL (ref 6–23)
CO2: 24 meq/L (ref 19–32)
Calcium: 8.8 mg/dL (ref 8.4–10.5)
Chloride: 106 meq/L (ref 96–112)
Creatinine, Ser: 0.68 mg/dL (ref 0.40–1.20)
GFR: 107.79 mL/min (ref >60.00)
Glucose, Bld: 83 mg/dL (ref 70–99)
Potassium: 4.1 meq/L (ref 3.5–5.1)
Sodium: 136 meq/L (ref 135–145)
Total Bilirubin: 0.5 mg/dL (ref 0.2–1.2)
Total Protein: 7.1 g/dL (ref 6.0–8.3)

## 2022-10-04 LAB — LIPID PANEL
Cholesterol: 146 mg/dL (ref 0–200)
HDL: 62.5 mg/dL (ref >39.00)
LDL Cholesterol: 66 mg/dL (ref 0–99)
NonHDL: 83.98
Total CHOL/HDL Ratio: 2
Triglycerides: 90 mg/dL (ref 0.0–149.0)
VLDL: 18 mg/dL (ref 0.0–40.0)

## 2022-10-04 MED ORDER — BUSPIRONE HCL 5 MG PO TABS
5.0000 mg | ORAL_TABLET | Freq: Two times a day (BID) | ORAL | 12 refills | Status: DC
Start: 2022-10-04 — End: 2023-02-03

## 2022-10-04 MED ORDER — PANTOPRAZOLE SODIUM 40 MG PO TBEC: 40.0000 mg | DELAYED_RELEASE_TABLET | Freq: Two times a day (BID) | ORAL | 2 refills | Status: DC

## 2022-10-04 MED ORDER — LINACLOTIDE 72 MCG PO CAPS: 72.0000 ug | ORAL_CAPSULE | Freq: Every day | ORAL | 1 refills | Status: DC

## 2022-10-04 NOTE — Assessment & Plan Note (Signed)
She had an EGD done which showed esophagitis and chronic inflammation.  She states that she has been out of her medications for a while because her insurance is not covering them at [facility].  Will send Protonix 40 mg twice a day for 10 weeks to a different pharmacy and then start once a day.  Encouraged her to reach out to GI to schedule a follow-up appointment.  Also encouraged to reach out if she is unable to fill her prescriptions at this new pharmacy.

## 2022-10-04 NOTE — Patient Instructions (Signed)
It was great to see you!  We are checking your labs today and will let you know the results via mychart/phone.   I have refilled your medications. Please let me know if the pharmacy is unable to fill them.   Start linzess 1 capsule daily for constipation. Take this in the morning before breakfast.   Let's follow-up in 1 year, sooner if you have concerns.  If a referral was placed today, you will be contacted for an appointment. Please note that routine referrals can sometimes take up to 3-4 weeks to process. Please call our office if you haven't heard anything after this time frame.  Take care,  Rodman Pickle, NP

## 2022-10-04 NOTE — Assessment & Plan Note (Signed)
Health maintenance reviewed and updated. Discussed nutrition, exercise. Check CMP, CBC today. Follow-up 1 year.   

## 2022-10-04 NOTE — Progress Notes (Signed)
BP 124/80 (BP Location: Left Arm)   Pulse 70   Temp 97.8 F (36.6 C)   Wt 203 lb 6.4 oz (92.3 kg)   LMP 08/28/2022 (Exact Date) Comment: had on 08/28/22 and 09/18/22  SpO2 100%   BMI 32.83 kg/m    Subjective:    Patient ID: Misty Simpson, female    DOB: May 04, 1980, 42 y.o.   MRN: [id number]  CC: Chief Complaint  Patient presents with   Annual Exam    With lab work-patient is not fasting, discuss medication    HPI: Misty Simpson is a 42 y.o. female presenting on 10/04/2022 for comprehensive medical examination. Current medical complaints include: insurance is not covering medications  She states that she was taking protonix twice a day and buspar 5mg  twice a day, however her insurance stopped covering medication at [facility]. She states that it was helping her symptoms when taking it.   She currently lives with: son Menopausal Symptoms: no  Depression and Anxiety Screen done today and results listed below:     10/04/2022   10:08 AM 03/27/2022    9:39 AM 05/01/2021   10:19 AM 11/29/2020    3:46 PM 03/19/2019    8:18 AM  Depression screen PHQ 2/9  Decreased Interest 1 1 0 1 1  Down, Depressed, Hopeless 1 1 1 1 1   PHQ - 2 Score 2 2 1 2 2   Altered sleeping 1 1 1 1 1   Tired, decreased energy 1 1 1 1 2   Change in appetite 1 1 1 1  0  Feeling bad or failure about yourself  1 1 1 1  0  Trouble concentrating 1 0 0 0 0  Moving slowly or fidgety/restless 0 0 0 0 0  Suicidal thoughts 0 0 0 0 0  PHQ-9 Score 7 6 5 6 5   Difficult doing work/chores Somewhat difficult  Somewhat difficult        10/04/2022   10:08 AM 03/27/2022    9:39 AM 05/01/2021   10:19 AM 11/29/2020    3:46 PM  GAD 7 : Generalized Anxiety Score  Nervous, Anxious, on Edge 2 2 2 1   Control/stop worrying 2 2 2 1   Worry too much - different things 2 2 2 1   Trouble relaxing 2 2 2 1   Restless 1 1 2  0  Easily annoyed or irritable 1 2 0 1  Afraid - awful might happen 2 2 2 1   Total GAD 7 Score 12 13 12 6    Anxiety Difficulty Somewhat difficult  Somewhat difficult     The patient does not have a history of falls. I did not complete a risk assessment for falls. A plan of care for falls was not documented.   Past Medical History:  Past Medical History:  Diagnosis Date   History of blood transfusion    Kidney stones    Ovarian cyst     Surgical History:  History reviewed. No pertinent surgical history.  Medications:  Current Outpatient Medications on File Prior to Visit  Medication Sig   Multiple Vitamin (MULTIVITAMIN) capsule Take 1 capsule by mouth daily.   polyethylene glycol powder (GLYCOLAX/MIRALAX) 17 GM/SCOOP powder Take 17 g by mouth daily.   No current facility-administered medications on file prior to visit.    Allergies:  No Known Allergies  Social History:  Social History   Socioeconomic History   Marital status: Single    Spouse name: Not on file   Number of children: Not on  file   Years of education: Not on file   Highest education level: Not on file  Occupational History   Not on file  Tobacco Use   Smoking status: Never   Smokeless tobacco: Never  Vaping Use   Vaping status: Never Used  Substance and Sexual Activity   Alcohol use: Yes    Comment: social   Drug use: No   Sexual activity: Yes    Birth control/protection: Condom  Other Topics Concern   Not on file  Social History Narrative   Not on file   Social Determinants of Health   Financial Resource Strain: Not on file  Food Insecurity: Not on file  Transportation Needs: Not on file  Physical Activity: Not on file  Stress: Not on file  Social Connections: Not on file  Intimate Partner Violence: Not on file   Social History   Tobacco Use  Smoking Status Never  Smokeless Tobacco Never   Social History   Substance and Sexual Activity  Alcohol Use Yes   Comment: social    Family History:  Family History  Problem Relation Age of Onset   Cancer Mother        skin   Heart  disease Father    Colon cancer Neg Hx    Colon polyps Neg Hx    Esophageal cancer Neg Hx    Rectal cancer Neg Hx    Stomach cancer Neg Hx     Past medical history, surgical history, medications, allergies, family history and social history reviewed with patient today and changes made to appropriate areas of the chart.   Review of Systems  Constitutional: Negative.   HENT: Negative.    Eyes: Negative.   Respiratory: Negative.    Cardiovascular: Negative.   Gastrointestinal:  Positive for constipation. Negative for abdominal pain.  Genitourinary: Negative.   Musculoskeletal: Negative.   Skin: Negative.   Neurological: Negative.   Psychiatric/Behavioral:  The patient is nervous/anxious.    All other ROS negative except what is listed above and in the HPI.      Objective:    BP 124/80 (BP Location: Left Arm)   Pulse 70   Temp 97.8 F (36.6 C)   Wt 203 lb 6.4 oz (92.3 kg)   LMP 08/28/2022 (Exact Date) Comment: had on 08/28/22 and 09/18/22  SpO2 100%   BMI 32.83 kg/m   Wt Readings from Last 3 Encounters:  10/04/22 203 lb 6.4 oz (92.3 kg)  03/27/22 200 lb 14.4 oz (91.1 kg)  03/22/22 203 lb (92.1 kg)    Physical Exam Vitals and nursing note reviewed.  Constitutional:      General: She is not in acute distress.    Appearance: Normal appearance.  HENT:     Head: Normocephalic and atraumatic.     Right Ear: Tympanic membrane, ear canal and external ear normal.     Left Ear: Tympanic membrane, ear canal and external ear normal.     Mouth/Throat:     Mouth: Mucous membranes are moist.     Pharynx: Oropharynx is clear. No posterior oropharyngeal erythema.  Eyes:     Conjunctiva/sclera: Conjunctivae normal.  Cardiovascular:     Rate and Rhythm: Normal rate and regular rhythm.     Pulses: Normal pulses.     Heart sounds: Normal heart sounds.  Pulmonary:     Effort: Pulmonary effort is normal.     Breath sounds: Normal breath sounds.  Abdominal:     Palpations: Abdomen is  soft.     Tenderness: There is no abdominal tenderness.  Musculoskeletal:        General: Normal range of motion.     Cervical back: Normal range of motion and neck supple.     Right lower leg: No edema.     Left lower leg: No edema.  Lymphadenopathy:     Cervical: No cervical adenopathy.  Skin:    General: Skin is warm and dry.  Neurological:     General: No focal deficit present.     Mental Status: She is alert and oriented to person, place, and time.     Cranial Nerves: No cranial nerve deficit.     Coordination: Coordination normal.     Gait: Gait normal.  Psychiatric:        Mood and Affect: Mood normal.        Behavior: Behavior normal.        Thought Content: Thought content normal.        Judgment: Judgment normal.     Results for orders placed or performed in visit on 03/27/22  Cytology - PAP  Result Value Ref Range   High risk HPV Negative    Neisseria Gonorrhea Negative    Chlamydia Negative    Adequacy      Satisfactory for evaluation; transformation zone component PRESENT.   Diagnosis      - Negative for intraepithelial lesion or malignancy (NILM)   Comment Normal Reference Ranger Chlamydia - Negative    Comment      Normal Reference Range Neisseria Gonorrhea - Negative   Comment Normal Reference Range HPV - Negative       Assessment & Plan:   Problem List Items Addressed This Visit       Digestive   Gastroesophageal reflux disease with esophagitis without hemorrhage    She had an EGD done which showed esophagitis and chronic inflammation.  She states that she has been out of her medications for a while because her insurance is not covering them at [facility].  Will send Protonix 40 mg twice a day for 10 weeks to a different pharmacy and then start once a day.  Encouraged her to reach out to GI to schedule a follow-up appointment.  Also encouraged to reach out if she is unable to fill her prescriptions at this new pharmacy.      Chronic constipation     Chronic, not controlled.  She still having some constipation.  She had an colonoscopy which was normal.  Will have her start Linzess 72 mcg daily.  Encourage fluids.        Other   Anxiety    Chronic, stable.  She states that the BuSpar 5 mg twice a day was helping with her symptoms, however she has been out of it for few months.  Refill sent to the pharmacy.  Follow-up in 1 year or sooner with concerns.      Relevant Medications   busPIRone (BUSPAR) 5 MG tablet   Routine general medical examination at a health care facility - Primary    Health maintenance reviewed and updated. Discussed nutrition, exercise. Check CMP, CBC today. Follow-up 1 year.        Relevant Orders   Comprehensive metabolic panel   CBC with Differential/Platelet   Other Visit Diagnoses     Screening for cardiovascular condition       Screen lipid panel today   Relevant Orders   Lipid panel   Immunization due  Tdap and flu vaccines given today   Relevant Orders   Tdap vaccine greater than or equal to 7yo IM   Flu vaccine trivalent PF, 6mos and older(Flulaval,Afluria,Fluarix,Fluzone)        Follow up plan: Return in about 1 year (around 10/04/2023) for CPE.   LABORATORY TESTING:  - Pap smear: up to date  IMMUNIZATIONS:   - Tdap: Tetanus vaccination status reviewed: Td vaccination indicated and given today. - Influenza: Administered today - Pneumovax: Not applicable - Prevnar: Not applicable - HPV: Not applicable - Shingrix vaccine: Not applicable  SCREENING: -Mammogram: Up to date  - Colonoscopy: Not applicable  - Bone Density: Not applicable   PATIENT COUNSELING:   Advised to take 1 mg of folate supplement per day if capable of pregnancy.   Sexuality: Discussed sexually transmitted diseases, partner selection, use of condoms, avoidance of unintended pregnancy  and contraceptive alternatives.   Advised to avoid cigarette smoking.  I discussed with the patient that most people  either abstain from alcohol or drink within safe limits (<=14/week and <=4 drinks/occasion for males, <=7/weeks and <= 3 drinks/occasion for females) and that the risk for alcohol disorders and other health effects rises proportionally with the number of drinks per week and how often a drinker exceeds daily limits.  Discussed cessation/primary prevention of drug use and availability of treatment for abuse.   Diet: Encouraged to adjust caloric intake to maintain  or achieve ideal body weight, to reduce intake of dietary saturated fat and total fat, to limit sodium intake by avoiding high sodium foods and not adding table salt, and to maintain adequate dietary potassium and calcium preferably from fresh fruits, vegetables, and low-fat dairy products.    stressed the importance of regular exercise  Injury prevention: Discussed safety belts, safety helmets, smoke detector, smoking near bedding or upholstery.   Dental health: Discussed importance of regular tooth brushing, flossing, and dental visits.    NEXT PREVENTATIVE PHYSICAL DUE IN 1 YEAR. Return in about 1 year (around 10/04/2023) for CPE.  Misty Simpson

## 2022-10-04 NOTE — Telephone Encounter (Signed)
Form printed out at visit and filled out and faxed. The printed form will be scanned into patient's chart.

## 2022-10-04 NOTE — Assessment & Plan Note (Signed)
Chronic, not controlled.  She still having some constipation.  She had an colonoscopy which was normal.  Will have her start Linzess 72 mcg daily.  Encourage fluids.

## 2022-10-04 NOTE — Assessment & Plan Note (Signed)
Chronic, stable.  She states that the BuSpar 5 mg twice a day was helping with her symptoms, however she has been out of it for few months.  Refill sent to the pharmacy.  Follow-up in 1 year or sooner with concerns.

## 2023-01-30 ENCOUNTER — Encounter: Payer: Self-pay | Admitting: Nurse Practitioner

## 2023-02-03 ENCOUNTER — Encounter: Payer: Self-pay | Admitting: Nurse Practitioner

## 2023-02-03 ENCOUNTER — Ambulatory Visit: Payer: 59 | Admitting: Nurse Practitioner

## 2023-02-03 VITALS — BP 120/62 | HR 72 | Temp 97.2°F | Ht 66.0 in | Wt 206.2 lb

## 2023-02-03 DIAGNOSIS — L989 Disorder of the skin and subcutaneous tissue, unspecified: Secondary | ICD-10-CM

## 2023-02-03 DIAGNOSIS — F419 Anxiety disorder, unspecified: Secondary | ICD-10-CM

## 2023-02-03 MED ORDER — MUPIROCIN CALCIUM 2 % EX CREA: 1.0000 | TOPICAL_CREAM | Freq: Two times a day (BID) | CUTANEOUS | 0 refills | Status: DC

## 2023-02-03 MED ORDER — PANTOPRAZOLE SODIUM 40 MG PO TBEC: 40.0000 mg | DELAYED_RELEASE_TABLET | Freq: Every day | ORAL | 1 refills | Status: DC

## 2023-02-03 MED ORDER — BUSPIRONE HCL 5 MG PO TABS
5.0000 mg | ORAL_TABLET | Freq: Two times a day (BID) | ORAL | 12 refills | Status: DC
Start: 2023-02-03 — End: 2023-11-27

## 2023-02-03 MED ORDER — LINACLOTIDE 72 MCG PO CAPS: 72.0000 ug | ORAL_CAPSULE | Freq: Every day | ORAL | 1 refills | Status: DC

## 2023-02-03 NOTE — Patient Instructions (Signed)
 It was great to see you!  Stat mupirocin  cream twice a day to the spot under your eye  Do warm compresses for 10 minutes 3-4 times a day.   Wash the area with soap and water  Let's follow-up if your symptoms worsen or don't improve.   Take care,  Tinnie Harada, NP

## 2023-02-03 NOTE — Addendum Note (Signed)
 Addended by: Rodman Pickle A on: 02/03/2023 04:48 PM   Modules accepted: Orders

## 2023-02-03 NOTE — Assessment & Plan Note (Signed)
 Chronic, stable.  She states that the BuSpar 5 mg twice a day was helping with her symptoms, however she has been out of it for few months.  Refill sent to the pharmacy.

## 2023-02-03 NOTE — Progress Notes (Signed)
 Acute Office Visit  Subjective:     Patient ID: Misty Simpson, female    DOB: 01/10/1981, 43 y.o.   MRN: [id number]  Chief Complaint  Patient presents with   Swelling and Irritation     Under right eye for 1 week    HPI Patient is in today for swelling and pain under right eye for 1 week.   Discussed the use of AI scribe software for clinical note transcription with the patient, who gave verbal consent to proceed.  History of Present Illness   The patient, with a history of weight issues, presents with a week-long history of eye discomfort and swelling. The discomfort began as pain and a headache, which she initially attributed to stress. However, the pain has since evolved into sharp, shooting pains under her right eye and noticed a pimple looking area. She states will drain clear drainage in the morning. She has been using an ice roller to manage the swelling. The patient also reports that her vision has been blurry when watching TV, but this has been a long-standing issue, not necessarily related to the current eye problem. She has not tried any other treatments for her eye, as she is afraid to do so. The patient also reports recent personal stress due to the loss of her dog to cancer.      ROS See pertinent positives and negatives per HPI.     Objective:    BP 120/62 (BP Location: Right Arm, Patient Position: Sitting, Cuff Size: Normal)   Pulse 72   Temp (!) 97.2 F (36.2 C)   Ht 5' 6 (1.676 m)   Wt 206 lb 3.2 oz (93.5 kg)   LMP 01/24/2023 (Exact Date)   SpO2 99%   BMI 33.28 kg/m    Physical Exam Vitals and nursing note reviewed.  Constitutional:      General: She is not in acute distress.    Appearance: Normal appearance.  HENT:     Head: Normocephalic.  Eyes:     Conjunctiva/sclera: Conjunctivae normal.  Musculoskeletal:     Cervical back: Normal range of motion.  Skin:    General: Skin is warm.     Findings: Lesion present.     Comments: Small,  red, raised area under right eye. Tender to touch. Not vesicular  Neurological:     General: No focal deficit present.     Mental Status: She is alert and oriented to person, place, and time.  Psychiatric:        Mood and Affect: Mood normal.        Behavior: Behavior normal.        Thought Content: Thought content normal.        Judgment: Judgment normal.       Assessment & Plan:   Problem List Items Addressed This Visit       Other   Anxiety   Chronic, stable.  She states that the BuSpar  5 mg twice a day was helping with her symptoms, however she has been out of it for few months.  Refill sent to the pharmacy.        Relevant Medications   busPIRone  (BUSPAR ) 5 MG tablet   Other Visit Diagnoses       Skin lesion    -  Primary   A painful, red, cyst under right eye for 1 week. Start Mupirocin  cream twice daily and apply warm compresses 3-4 times daily. F/U if not improving  Meds ordered this encounter  Medications   mupirocin  cream (BACTROBAN ) 2 %    Sig: Apply 1 Application topically 2 (two) times daily.    Dispense:  15 g    Refill:  0   busPIRone  (BUSPAR ) 5 MG tablet    Sig: Take 1 tablet (5 mg total) by mouth 2 (two) times daily.    Dispense:  60 tablet    Refill:  12   linaclotide  (LINZESS ) 72 MCG capsule    Sig: Take 1 capsule (72 mcg total) by mouth daily before breakfast.    Dispense:  30 capsule    Refill:  1   pantoprazole  (PROTONIX ) 40 MG tablet    Sig: Take 1 tablet (40 mg total) by mouth daily. Take for 10 weeks then go down to once a day    Dispense:  90 tablet    Refill:  1    Return if symptoms worsen or fail to improve.  Tinnie DELENA Harada, NP

## 2023-02-26 ENCOUNTER — Other Ambulatory Visit: Payer: Self-pay

## 2023-02-26 MED ORDER — MUPIROCIN CALCIUM 2 % EX CREA: 1.0000 | TOPICAL_CREAM | Freq: Two times a day (BID) | CUTANEOUS | 0 refills | Status: DC

## 2023-02-26 MED ORDER — MUPIROCIN 2 % EX OINT: 1.0000 | TOPICAL_OINTMENT | Freq: Two times a day (BID) | CUTANEOUS | 0 refills | Status: DC

## 2023-02-26 NOTE — Telephone Encounter (Signed)
 Requesting: Mupirocin  Calcium  2% Cream  Last Visit: 02/03/2023 Next Visit: Visit date not found Last Refill: 02/03/2023  Please Advise

## 2023-02-26 NOTE — Telephone Encounter (Signed)
 Copied from CRM [id number]. Topic: Clinical - Prescription Issue >> Feb 26, 2023  4:30 PM Zebedee SAUNDERS wrote: Reason for CRM: [facility] - [place], [place] - [address] [place] [address] Phone: [phone] Fax: [phone] Called per [facility] Muprocin Cream but insurance will not pay for it but will pay for cream. Please call : [phone].

## 2023-02-26 NOTE — Telephone Encounter (Signed)
 Reached out to Abe Abed at the pharmacy and she stated that insurance would only pay for the Mupirocin  ointment. Can we resend it for ointment please?

## 2023-02-26 NOTE — Addendum Note (Signed)
 Addended by: Cecillia Menees A on: 02/26/2023 04:51 PM   Modules accepted: Orders

## 2023-04-15 ENCOUNTER — Other Ambulatory Visit (HOSPITAL_BASED_OUTPATIENT_CLINIC_OR_DEPARTMENT_OTHER): Payer: Self-pay | Admitting: Nurse Practitioner

## 2023-04-15 DIAGNOSIS — Z1231 Encounter for screening mammogram for malignant neoplasm of breast: Secondary | ICD-10-CM

## 2023-04-18 ENCOUNTER — Ambulatory Visit (HOSPITAL_BASED_OUTPATIENT_CLINIC_OR_DEPARTMENT_OTHER)
Admission: RE | Admit: 2023-04-18 | Discharge: 2023-04-18 | Disposition: A | Discharge status: Home / Self Care | Source: Ambulatory Visit | Attending: Nurse Practitioner | Admitting: Nurse Practitioner

## 2023-04-18 ENCOUNTER — Encounter (HOSPITAL_BASED_OUTPATIENT_CLINIC_OR_DEPARTMENT_OTHER): Payer: Self-pay | Admitting: Radiology

## 2023-04-18 DIAGNOSIS — Z1231 Encounter for screening mammogram for malignant neoplasm of breast: Secondary | ICD-10-CM | POA: Diagnosis not present

## 2023-04-22 ENCOUNTER — Encounter: Payer: Self-pay | Admitting: Nurse Practitioner

## 2023-09-29 ENCOUNTER — Encounter: Payer: Self-pay | Admitting: Nurse Practitioner

## 2023-09-29 NOTE — Telephone Encounter (Signed)
 Left message to return call

## 2023-09-30 ENCOUNTER — Ambulatory Visit: Admitting: Family Medicine

## 2023-10-06 ENCOUNTER — Ambulatory Visit: Admitting: Nurse Practitioner

## 2023-10-06 ENCOUNTER — Encounter (HOSPITAL_BASED_OUTPATIENT_CLINIC_OR_DEPARTMENT_OTHER): Payer: Self-pay

## 2023-10-06 ENCOUNTER — Encounter: Payer: Self-pay | Admitting: Nurse Practitioner

## 2023-10-06 ENCOUNTER — Ambulatory Visit (HOSPITAL_BASED_OUTPATIENT_CLINIC_OR_DEPARTMENT_OTHER)
Admission: RE | Admit: 2023-10-06 | Discharge: 2023-10-06 | Disposition: A | Discharge status: Home / Self Care | Source: Ambulatory Visit | Attending: Nurse Practitioner | Admitting: Nurse Practitioner

## 2023-10-06 ENCOUNTER — Ambulatory Visit: Payer: Self-pay | Admitting: Nurse Practitioner

## 2023-10-06 VITALS — BP 124/72 | HR 72 | Temp 97.4°F | Ht 66.0 in | Wt 210.6 lb

## 2023-10-06 DIAGNOSIS — Z0001 Encounter for general adult medical examination with abnormal findings: Secondary | ICD-10-CM | POA: Diagnosis not present

## 2023-10-06 DIAGNOSIS — R1031 Right lower quadrant pain: Secondary | ICD-10-CM

## 2023-10-06 DIAGNOSIS — E669 Obesity, unspecified: Secondary | ICD-10-CM

## 2023-10-06 DIAGNOSIS — R161 Splenomegaly, not elsewhere classified: Secondary | ICD-10-CM

## 2023-10-06 DIAGNOSIS — Z6833 Body mass index (BMI) 33.0-33.9, adult: Secondary | ICD-10-CM

## 2023-10-06 DIAGNOSIS — Z23 Encounter for immunization: Secondary | ICD-10-CM

## 2023-10-06 DIAGNOSIS — F419 Anxiety disorder, unspecified: Secondary | ICD-10-CM

## 2023-10-06 DIAGNOSIS — Z0000 Encounter for general adult medical examination without abnormal findings: Secondary | ICD-10-CM

## 2023-10-06 DIAGNOSIS — K2100 Gastro-esophageal reflux disease with esophagitis, without bleeding: Secondary | ICD-10-CM

## 2023-10-06 DIAGNOSIS — F32A Depression, unspecified: Secondary | ICD-10-CM

## 2023-10-06 DIAGNOSIS — K5909 Other constipation: Secondary | ICD-10-CM

## 2023-10-06 LAB — COMPREHENSIVE METABOLIC PANEL WITH GFR
ALT: 15 U/L (ref 0–35)
AST: 14 U/L (ref 0–37)
Albumin: 4 g/dL (ref 3.5–5.2)
Alkaline Phosphatase: 73 U/L (ref 39–117)
BUN: 10 mg/dL (ref 6–23)
CO2: 27 meq/L (ref 19–32)
Calcium: 8.7 mg/dL (ref 8.4–10.5)
Chloride: 104 meq/L (ref 96–112)
Creatinine, Ser: 0.66 mg/dL (ref 0.40–1.20)
GFR: 107.8 mL/min (ref >60.00)
Glucose, Bld: 90 mg/dL (ref 70–99)
Potassium: 3.6 meq/L (ref 3.5–5.1)
Sodium: 137 meq/L (ref 135–145)
Total Bilirubin: 0.4 mg/dL (ref 0.2–1.2)
Total Protein: 7.1 g/dL (ref 6.0–8.3)

## 2023-10-06 LAB — POCT URINE PREGNANCY: Preg Test, Ur: NEGATIVE

## 2023-10-06 LAB — CBC WITH DIFFERENTIAL/PLATELET
Basophils Absolute: 0 K/uL (ref 0.0–0.1)
Basophils Relative: 0.6 % (ref 0.0–3.0)
Eosinophils Absolute: 0.1 K/uL (ref 0.0–0.7)
Eosinophils Relative: 1.7 % (ref 0.0–5.0)
HCT: 35 % — ABNORMAL LOW (ref 36.0–46.0)
Hemoglobin: 11.6 g/dL — ABNORMAL LOW (ref 12.0–15.0)
Lymphocytes Relative: 31.3 % (ref 12.0–46.0)
Lymphs Abs: 1.9 K/uL (ref 0.7–4.0)
MCHC: 33.3 g/dL (ref 30.0–36.0)
MCV: 77.8 fl — ABNORMAL LOW (ref 78.0–100.0)
Monocytes Absolute: 0.5 K/uL (ref 0.1–1.0)
Monocytes Relative: 8.8 % (ref 3.0–12.0)
Neutro Abs: 3.6 K/uL (ref 1.4–7.7)
Neutrophils Relative %: 57.6 % (ref 43.0–77.0)
Platelets: 307 K/uL (ref 150.0–400.0)
RBC: 4.49 Mil/uL (ref 3.87–5.11)
RDW: 15.4 % (ref 11.5–15.5)
WBC: 6.2 K/uL (ref 4.0–10.5)

## 2023-10-06 MED ORDER — LINACLOTIDE 145 MCG PO CAPS: 145.0000 ug | ORAL_CAPSULE | Freq: Every day | ORAL | 1 refills | Status: DC

## 2023-10-06 MED ORDER — NALTREXONE-BUPROPION HCL ER 8-90 MG PO TB12: ORAL_TABLET | ORAL | 0 refills | Status: DC

## 2023-10-06 MED ORDER — SERTRALINE HCL 25 MG PO TABS
25.0000 mg | ORAL_TABLET | Freq: Every day | ORAL | 1 refills | Status: DC
Start: 2023-10-06 — End: 2023-12-02

## 2023-10-06 MED ORDER — IOHEXOL 300 MG/ML  SOLN
100.0000 mL | Freq: Once | INTRAMUSCULAR | Status: AC | PRN
Start: 2023-10-06 — End: 2023-10-06
  Administered 2023-10-06: 100 mL via INTRAVENOUS

## 2023-10-06 NOTE — Progress Notes (Signed)
 BP 124/72 (BP Location: Left Arm, Patient Position: Sitting, Cuff Size: Normal)   Pulse 72   Temp (!) 97.4 F (36.3 C)   Ht 5' 6 (1.676 m)   Wt 210 lb 9.6 oz (95.5 kg)   LMP 09/05/2023 (Exact Date)   SpO2 99%   BMI 33.99 kg/m    Subjective:    Patient ID: Francy Elsie Haagensen, female    DOB: Apr 20, 1980, 43 y.o.   MRN: [id number]  CC: Chief Complaint  Patient presents with   Annual Exam    With fasting lab, concerns with constipation, Flu Vaccine, emotional, lower right abdominal pain    HPI: Deborh Pense is a 43 y.o. female presenting on 10/06/2023 for comprehensive medical examination. Current medical complaints include:RLQ pain, emotional, constipation   Discussed the use of AI scribe software for clinical note transcription with the patient, who gave verbal consent to proceed.  She experiences severe abdominal pain, particularly before bowel movements, with a sensation of engorgement and swelling. Cramping is present, and bowel movements start watery followed by a solid mass. She takes Linzess  72 micrograms in the afternoon, which is more effective than morning dosing, but constipation persists, raising concerns about a blockage. No nausea or vomiting is present.  She follows an inflammation diet, avoiding certain foods, and drinks green and dandelion tea. Her diet includes overnight oats with pumpkin and seeds, gluten-free bread, and sourdough. She avoids fast food and soda, and occasionally uses CBD gummies for sleep.  She exercises regularly, walking and using stairs at work, but has gained weight and feels unwell. She is interested in weight loss medication but worries about side effects. She has been trying to lose weight for several years. She limits fast foods and has been trying to eat whole foods that don't affect her stomach.   Increased anxiety and mood changes have occurred over the past six months due to life stressors, including selling her house and a  breakup. She experiences crying spells, chest pain, and shortness of breath during stress. She is open to therapy and medication     Depression and Anxiety Screen done today and results listed below:     10/06/2023    9:54 AM 10/04/2022   10:08 AM 03/27/2022    9:39 AM 05/01/2021   10:19 AM 11/29/2020    3:46 PM  Depression screen PHQ 2/9  Decreased Interest 1 1 1  0 1  Down, Depressed, Hopeless 1 1 1 1 1   PHQ - 2 Score 2 2 2 1 2   Altered sleeping 2 1 1 1 1   Tired, decreased energy 1 1 1 1 1   Change in appetite 0 1 1 1 1   Feeling bad or failure about yourself  0 1 1 1 1   Trouble concentrating 1 1 0 0 0  Moving slowly or fidgety/restless 0 0 0 0 0  Suicidal thoughts 0 0 0 0 0  PHQ-9 Score 6 7 6 5 6   Difficult doing work/chores Very difficult Somewhat difficult  Somewhat difficult       10/06/2023    9:55 AM 10/04/2022   10:08 AM 03/27/2022    9:39 AM 05/01/2021   10:19 AM  GAD 7 : Generalized Anxiety Score  Nervous, Anxious, on Edge 1 2 2 2   Control/stop worrying 1 2 2 2   Worry too much - different things 1 2 2 2   Trouble relaxing 1 2 2 2   Restless 0 1 1 2   Easily annoyed or irritable 1  1 2 0  Afraid - awful might happen 1 2 2 2   Total GAD 7 Score 6 12 13 12   Anxiety Difficulty Very difficult Somewhat difficult  Somewhat difficult    The patient does not have a history of falls. I did not complete a risk assessment for falls. A plan of care for falls was not documented.   Past Medical History:  Past Medical History:  Diagnosis Date   Anemia    Anxiety    GERD (gastroesophageal reflux disease)    History of blood transfusion    Kidney stones    Ovarian cyst     Surgical History:  History reviewed. No pertinent surgical history.  Medications:  Current Outpatient Medications on File Prior to Visit  Medication Sig   busPIRone  (BUSPAR ) 5 MG tablet Take 1 tablet (5 mg total) by mouth 2 (two) times daily.   Multiple Vitamin (MULTIVITAMIN) capsule Take 1 capsule by mouth  daily.   mupirocin  cream (BACTROBAN ) 2 % Apply 1 Application topically 2 (two) times daily.   mupirocin  ointment (BACTROBAN ) 2 % Apply 1 Application topically 2 (two) times daily.   pantoprazole  (PROTONIX ) 40 MG tablet Take 1 tablet (40 mg total) by mouth daily.   polyethylene glycol powder (GLYCOLAX /MIRALAX ) 17 GM/SCOOP powder Take 17 g by mouth daily. (Patient not taking: Reported on 02/03/2023)   No current facility-administered medications on file prior to visit.    Allergies:  No Known Allergies  Social History:  Social History   Socioeconomic History   Marital status: Single    Spouse name: Not on file   Number of children: Not on file   Years of education: Not on file   Highest education level: Associate degree: academic program  Occupational History   Not on file  Tobacco Use   Smoking status: Never   Smokeless tobacco: Never  Vaping Use   Vaping status: Never Used  Substance and Sexual Activity   Alcohol use: Not Currently    Comment: social   Drug use: No   Sexual activity: Yes    Birth control/protection: Condom  Other Topics Concern   Not on file  Social History Narrative   Not on file   Social Drivers of Health   Financial Resource Strain: High Risk (10/02/2023)   Overall Financial Resource Strain (CARDIA)    Difficulty of Paying Living Expenses: Hard  Food Insecurity: No Food Insecurity (10/02/2023)   Hunger Vital Sign    Worried About Running Out of Food in the Last Year: Never true    Ran Out of Food in the Last Year: Never true  Transportation Needs: No Transportation Needs (10/02/2023)   PRAPARE - Administrator, Civil Service (Medical): No    Lack of Transportation (Non-Medical): No  Physical Activity: Sufficiently Active (10/02/2023)   Exercise Vital Sign    Days of Exercise per Week: 5 days    Minutes of Exercise per Session: 60 min  Stress: Stress Concern Present (10/02/2023)   [employer] of Occupational Health - Occupational  Stress Questionnaire    Feeling of Stress: Rather much  Social Connections: Unknown (10/02/2023)   Social Connection and Isolation Panel    Frequency of Communication with Friends and Family: Once a week    Frequency of Social Gatherings with Friends and Family: Patient declined    Attends Religious Services: More than 4 times per year    Active Member of [employer] or Organizations: No    Attends Banker  Meetings: Not on file    Marital Status: Living with partner  Intimate Partner Violence: Not on file   Social History   Tobacco Use  Smoking Status Never  Smokeless Tobacco Never   Social History   Substance and Sexual Activity  Alcohol Use Not Currently   Comment: social    Family History:  Family History  Problem Relation Age of Onset   Cancer Mother        skin   Heart disease Father    Colon cancer Neg Hx    Colon polyps Neg Hx    Esophageal cancer Neg Hx    Rectal cancer Neg Hx    Stomach cancer Neg Hx     Past medical history, surgical history, medications, allergies, family history and social history reviewed with patient today and changes made to appropriate areas of the chart.   Review of Systems  Constitutional:  Positive for malaise/fatigue. Negative for fever.  HENT: Negative.    Eyes: Negative.   Respiratory: Negative.    Cardiovascular: Negative.   Gastrointestinal:  Positive for abdominal pain (RLQ), constipation and heartburn. Negative for diarrhea, nausea and vomiting.  Genitourinary: Negative.   Musculoskeletal: Negative.   Skin: Negative.   Neurological: Negative.   Psychiatric/Behavioral:  Positive for depression. Negative for suicidal ideas. The patient is nervous/anxious.    All other ROS negative except what is listed above and in the HPI.      Objective:    BP 124/72 (BP Location: Left Arm, Patient Position: Sitting, Cuff Size: Normal)   Pulse 72   Temp (!) 97.4 F (36.3 C)   Ht 5' 6 (1.676 m)   Wt 210 lb 9.6 oz (95.5 kg)    LMP 09/05/2023 (Exact Date)   SpO2 99%   BMI 33.99 kg/m   Wt Readings from Last 3 Encounters:  10/06/23 210 lb 9.6 oz (95.5 kg)  02/03/23 206 lb 3.2 oz (93.5 kg)  10/04/22 203 lb 6.4 oz (92.3 kg)    Physical Exam Vitals and nursing note reviewed.  Constitutional:      General: She is not in acute distress.    Appearance: Normal appearance.  HENT:     Head: Normocephalic and atraumatic.     Right Ear: Tympanic membrane, ear canal and external ear normal.     Left Ear: Tympanic membrane, ear canal and external ear normal.     Mouth/Throat:     Mouth: Mucous membranes are moist.     Pharynx: No posterior oropharyngeal erythema.  Eyes:     Conjunctiva/sclera: Conjunctivae normal.  Cardiovascular:     Rate and Rhythm: Normal rate and regular rhythm.     Pulses: Normal pulses.     Heart sounds: Normal heart sounds.  Pulmonary:     Effort: Pulmonary effort is normal.     Breath sounds: Normal breath sounds.  Abdominal:     General: Bowel sounds are decreased.     Palpations: Abdomen is soft.     Tenderness: There is abdominal tenderness in the right lower quadrant. There is guarding.  Musculoskeletal:        General: Normal range of motion.     Cervical back: Normal range of motion and neck supple.     Right lower leg: No edema.     Left lower leg: No edema.  Lymphadenopathy:     Cervical: No cervical adenopathy.  Skin:    General: Skin is warm and dry.  Neurological:     General:  No focal deficit present.     Mental Status: She is alert and oriented to person, place, and time.     Cranial Nerves: No cranial nerve deficit.     Coordination: Coordination normal.     Gait: Gait normal.  Psychiatric:        Mood and Affect: Mood normal. Affect is tearful.        Behavior: Behavior normal.        Thought Content: Thought content normal.        Judgment: Judgment normal.     Results for orders placed or performed in visit on 10/04/22  Comprehensive metabolic panel    Collection Time: 10/04/22 10:36 AM  Result Value Ref Range   Sodium 136 135 - 145 mEq/L   Potassium 4.1 3.5 - 5.1 mEq/L   Chloride 106 96 - 112 mEq/L   CO2 24 19 - 32 mEq/L   Glucose, Bld 83 70 - 99 mg/dL   BUN 15 6 - 23 mg/dL   Creatinine, Ser 9.31 0.40 - 1.20 mg/dL   Total Bilirubin 0.5 0.2 - 1.2 mg/dL   Alkaline Phosphatase 78 39 - 117 U/L   AST 14 0 - 37 U/L   ALT 16 0 - 35 U/L   Total Protein 7.1 6.0 - 8.3 g/dL   Albumin 4.0 3.5 - 5.2 g/dL   GFR 892.20 >39.99 mL/min   Calcium  8.8 8.4 - 10.5 mg/dL  CBC with Differential/Platelet   Collection Time: 10/04/22 10:36 AM  Result Value Ref Range   WBC 7.0 4.0 - 10.5 K/uL   RBC 4.62 3.87 - 5.11 Mil/uL   Hemoglobin 11.7 (L) 12.0 - 15.0 g/dL   HCT 64.4 (L) 63.9 - 53.9 %   MCV 76.9 (L) 78.0 - 100.0 fl   MCHC 32.9 30.0 - 36.0 g/dL   RDW 84.1 (H) 88.4 - 84.4 %   Platelets 319.0 150.0 - 400.0 K/uL   Neutrophils Relative % 55.4 43.0 - 77.0 %   Lymphocytes Relative 30.4 12.0 - 46.0 %   Monocytes Relative 10.8 3.0 - 12.0 %   Eosinophils Relative 2.7 0.0 - 5.0 %   Basophils Relative 0.7 0.0 - 3.0 %   Neutro Abs 3.9 1.4 - 7.7 K/uL   Lymphs Abs 2.1 0.7 - 4.0 K/uL   Monocytes Absolute 0.8 0.1 - 1.0 K/uL   Eosinophils Absolute 0.2 0.0 - 0.7 K/uL   Basophils Absolute 0.0 0.0 - 0.1 K/uL  Lipid panel   Collection Time: 10/04/22 10:36 AM  Result Value Ref Range   Cholesterol 146 0 - 200 mg/dL   Triglycerides 09.9 0.0 - 149.0 mg/dL   HDL 37.49 >60.99 mg/dL   VLDL 81.9 0.0 - 59.9 mg/dL   LDL Cholesterol 66 0 - 99 mg/dL   Total CHOL/HDL Ratio 2    NonHDL 83.98       Assessment & Plan:   Problem List Items Addressed This Visit       Digestive   Gastroesophageal reflux disease with esophagitis without hemorrhage   Chronic constipation   She experiences intermittent right lower quadrant pain with constipation for 3 weeks. She has significant pain with light palpation to RLQ. Increase Linzess  dosage to 145mcg daily. Order a CT scan of  the abdomen. Check urine pregnancy, CMP, CBC.       Relevant Orders   POCT urine pregnancy     Other   Anxiety and depression   Chronic, not controlled. She has increased anxiety and depression  due to life stressors and has not previously undergone therapy. She is interested in medication. Start Zoloft  25mg  daily and refer her to a therapist for counseling. Continue buspar  5mg  BID.       Relevant Medications   sertraline  (ZOLOFT ) 25 MG tablet   Other Relevant Orders   Ambulatory referral to Psychology   Routine general medical examination at a health care facility - Primary   Relevant Orders   CBC with Differential/Platelet   Comprehensive metabolic panel with GFR   Obesity (BMI 30-39.9)   BMI 33.9. She has a high BMI and is interested in weight loss medication as lifestyle alone has not been working. Start Contrave  with a titration schedule: one pill once a day for seven days, then one pill twice a day for seven days, then two in the morning and one at night, and finally two and two. Discuss potential side effects and expected outcomes of Contrave . Continue regular exercise and limiting fast foods. Follow-up in 4-6 weeks.       Relevant Medications   Naltrexone -buPROPion  HCl ER 8-90 MG TB12   Other Visit Diagnoses       RLQ abdominal pain       Severe RLQ pain. Concern for ovarian cyst vs appendicitis. Check Stat CT today.   Relevant Orders   CT ABDOMEN PELVIS W CONTRAST     Immunization due       Flu vaccine given today   Relevant Orders   Flu vaccine trivalent PF, 6mos and older(Flulaval,Afluria,Fluarix,Fluzone)       Follow up plan: Return in about 4 weeks (around 11/03/2023) for 4-6 weeks, Anxiety, Depression can be virtual .   LABORATORY TESTING:  - Pap smear: up to date  IMMUNIZATIONS:   - Tdap: Tetanus vaccination status reviewed: last tetanus booster within 10 years. - Influenza: Administered today - Pneumovax: Not applicable - Prevnar: Not applicable -  HPV: Not applicable - Shingrix vaccine: Not applicable  SCREENING: -Mammogram: Up to date  - Colonoscopy: Not applicable  - Bone Density: Not applicable   PATIENT COUNSELING:   Advised to take 1 mg of folate supplement per day if capable of pregnancy.   Sexuality: Discussed sexually transmitted diseases, partner selection, use of condoms, avoidance of unintended pregnancy  and contraceptive alternatives.   Advised to avoid cigarette smoking.  I discussed with the patient that most people either abstain from alcohol or drink within safe limits (<=14/week and <=4 drinks/occasion for males, <=7/weeks and <= 3 drinks/occasion for females) and that the risk for alcohol disorders and other health effects rises proportionally with the number of drinks per week and how often a drinker exceeds daily limits.  Discussed cessation/primary prevention of drug use and availability of treatment for abuse.   Diet: Encouraged to adjust caloric intake to maintain  or achieve ideal body weight, to reduce intake of dietary saturated fat and total fat, to limit sodium intake by avoiding high sodium foods and not adding table salt, and to maintain adequate dietary potassium and calcium  preferably from fresh fruits, vegetables, and low-fat dairy products.    stressed the importance of regular exercise  Injury prevention: Discussed safety belts, safety helmets, smoke detector, smoking near bedding or upholstery.   Dental health: Discussed importance of regular tooth brushing, flossing, and dental visits.    NEXT PREVENTATIVE PHYSICAL DUE IN 1 YEAR. Return in about 4 weeks (around 11/03/2023) for 4-6 weeks, Anxiety, Depression can be virtual .  Tinnie DELENA Harada

## 2023-10-06 NOTE — Assessment & Plan Note (Signed)
 BMI 33.9. She has a high BMI and is interested in weight loss medication as lifestyle alone has not been working. Start Contrave  with a titration schedule: one pill once a day for seven days, then one pill twice a day for seven days, then two in the morning and one at night, and finally two and two. Discuss potential side effects and expected outcomes of Contrave . Continue regular exercise and limiting fast foods. Follow-up in 4-6 weeks.

## 2023-10-06 NOTE — Patient Instructions (Addendum)
 It was great to see you!  We are checking your labs today and will let you know the results via mychart/phone.   Start zoloft  1 tablet daily for your mood. I have placed a referral to a therapist   Start contrave  1 tablet daily for 7 days, then 1 tablet twice a day for 7 days, then 2 tablets in the morning and 1 tablet at night for 7 days, then 2 tablets daily  Increase linzess  to 145 mcg daily   Let's get a CT of your stomach   Let's follow-up in 4-6 weeks, sooner if you have concerns.  If a referral was placed today, you will be contacted for an appointment. Please note that routine referrals can sometimes take up to 3-4 weeks to process. Please call our office if you haven't heard anything after this time frame.  Take care,  Tinnie Harada, NP

## 2023-10-06 NOTE — Assessment & Plan Note (Signed)
 She experiences intermittent right lower quadrant pain with constipation for 3 weeks. She has significant pain with light palpation to RLQ. Increase Linzess  dosage to 145mcg daily. Order a CT scan of the abdomen. Check urine pregnancy, CMP, CBC.

## 2023-10-06 NOTE — Assessment & Plan Note (Signed)
 Chronic, not controlled. She has increased anxiety and depression due to life stressors and has not previously undergone therapy. She is interested in medication. Start Zoloft  25mg  daily and refer her to a therapist for counseling. Continue buspar  5mg  BID.

## 2023-10-23 ENCOUNTER — Ambulatory Visit
Admission: RE | Admit: 2023-10-23 | Discharge: 2023-10-23 | Disposition: A | Source: Ambulatory Visit | Attending: Nurse Practitioner | Admitting: Nurse Practitioner

## 2023-10-23 ENCOUNTER — Ambulatory Visit (INDEPENDENT_AMBULATORY_CARE_PROVIDER_SITE_OTHER): Admitting: Obstetrics and Gynecology

## 2023-10-23 ENCOUNTER — Other Ambulatory Visit (HOSPITAL_COMMUNITY)
Admission: RE | Admit: 2023-10-23 | Discharge: 2023-10-23 | Disposition: A | Discharge status: Home / Self Care | Source: Ambulatory Visit | Attending: Obstetrics and Gynecology | Admitting: Obstetrics and Gynecology

## 2023-10-23 ENCOUNTER — Other Ambulatory Visit: Payer: Self-pay

## 2023-10-23 ENCOUNTER — Encounter: Payer: Self-pay | Admitting: Obstetrics and Gynecology

## 2023-10-23 VITALS — BP 132/83 | HR 87 | Wt 204.8 lb

## 2023-10-23 DIAGNOSIS — Z01419 Encounter for gynecological examination (general) (routine) without abnormal findings: Secondary | ICD-10-CM | POA: Diagnosis not present

## 2023-10-23 DIAGNOSIS — M62838 Other muscle spasm: Secondary | ICD-10-CM | POA: Diagnosis not present

## 2023-10-23 DIAGNOSIS — D259 Leiomyoma of uterus, unspecified: Secondary | ICD-10-CM

## 2023-10-23 DIAGNOSIS — R161 Splenomegaly, not elsewhere classified: Secondary | ICD-10-CM

## 2023-10-23 DIAGNOSIS — Z202 Contact with and (suspected) exposure to infections with a predominantly sexual mode of transmission: Secondary | ICD-10-CM

## 2023-10-23 NOTE — Progress Notes (Signed)
   ANNUAL EXAM Patient name: Misty Simpson MRN [id number]  Date of birth: 21-May-1980 Chief Complaint:   Gynecologic Exam  History of Present Illness:   Misty Simpson is a 43 y.o. G68P1001 female being seen today for a routine annual exam.   Current concerns: Right sided abdominal pain. Had CT which was normal overall except splenomegaly. Showed suspected fibroids - I reviewed the imaged and it does look like likely fibroids but they all appear small. Uterus is about 9-10cm. She just had US  of her spleen today but report pending. UPT neg on 9/15. Pain with intercourse. Pain with lifting.   Current birth control: None  Patient's last menstrual period was 09/05/2023 (exact date).   Last MXR: birad1, cat b density, 04/22/23  Last Pap/Pap History:  H/O abnormal pap: no 2009: pap wnl 2010: pap wnl 2013: pap wnl 2021: pap/hpv wnl 03/2022: pap/hpv wnl  Review of Systems:   Pertinent items are noted in HPI Denies any headaches, blurred vision, fatigue, shortness of breath, chest pain, abdominal pain, abnormal vaginal discharge/itching/odor/irritation, problems with periods, bowel movements, urination, or intercourse unless otherwise stated above.  Pertinent History Reviewed:  Reviewed past medical,surgical, social and family history.  Reviewed problem list, medications and allergies. Physical Assessment:   Vitals:   10/23/23 1404  BP: 132/83  Pulse: 87  Weight: 204 lb 12.8 oz (92.9 kg)  Body mass index is 33.06 kg/m.   Physical Examination:  General appearance - well appearing, and in no distress Mental status - alert, oriented to person, place, and time Psych:  She has a normal mood and affect Skin - warm and dry, normal color, no suspicious lesions noted Chest - effort normal Heart - normal rate  Breasts - breasts appear normal, no suspicious masses, no skin or nipple changes or axillary nodes Abdomen - soft, nontender, nondistended, no masses or  organomegaly Pelvic -  Performed and: VULVA: normal appearing vulva with no masses, tenderness or lesions VAGINA: levator spasm and obturator spasms bilaterally - muscles are hypertonic.  CERVIX: No CMT UTERUS: Not examined ADNEXA: Not examined Extremities:  No swelling or varicosities noted  Chaperone present for exam  No results found for this or any previous visit (from the past 24 hours).  Assessment & Plan:  Misty Simpson was seen today for gynecologic exam.  Diagnoses and all orders for this visit:  Encounter for annual routine gynecological examination - Cervical cancer screening: Discussed guidelines. Pap with HPV wnl 03/2022 - STD Testing: accepts - Birth Control: Discussed options and their risks, benefits and common side effects; discussed VTE with estrogen containing options. Desires: None - Breast Health: Encouraged self breast awareness/SBE. Teaching provided. Discussed limits of clinical breast exam for detecting breast cancer. MXR is up to date: 04/2023 - F/U 12 months and prn  Possible exposure to STI -     Cervicovaginal ancillary only( [place])  Uterine leiomyoma, unspecified location Has known history of subserosal fibroid. Will hold on US  unless no improvement from PT -     Cancel: US  PELVIC COMPLETE WITH TRANSVAGINAL; Future  Levator spasm Definite pelvic floor dysfunction. Recommend PFPT. Patient motivated to go.  -     Ambulatory referral to Physical Therapy    Orders Placed This Encounter  Procedures   Ambulatory referral to Physical Therapy    Meds: No orders of the defined types were placed in this encounter.   Follow-up: No follow-ups on file.  Vina Solian, MD 10/23/2023 2:36 PM

## 2023-10-24 LAB — CERVICOVAGINAL ANCILLARY ONLY
Chlamydia: NEGATIVE
Comment: NEGATIVE
Comment: NEGATIVE
Comment: NORMAL
Neisseria Gonorrhea: NEGATIVE
Trichomonas: NEGATIVE

## 2023-10-27 ENCOUNTER — Ambulatory Visit: Payer: Self-pay | Admitting: Nurse Practitioner

## 2023-10-27 DIAGNOSIS — R161 Splenomegaly, not elsewhere classified: Secondary | ICD-10-CM

## 2023-11-17 ENCOUNTER — Encounter: Payer: Self-pay | Admitting: Nurse Practitioner

## 2023-11-18 ENCOUNTER — Other Ambulatory Visit (HOSPITAL_COMMUNITY): Payer: Self-pay

## 2023-11-18 ENCOUNTER — Telehealth: Payer: Self-pay

## 2023-11-18 NOTE — Telephone Encounter (Signed)
 Patient notified via mychart

## 2023-11-18 NOTE — Telephone Encounter (Signed)
 No P/A Needed. Pt uses her [place] and discount card to get Rx at a reduced price

## 2023-11-18 NOTE — Telephone Encounter (Signed)
 Can we get a prior auth on Naltrexone -buPROPion  HCl ER 8-90 MG TB12?

## 2023-11-24 MED ORDER — NALTREXONE-BUPROPION HCL ER 8-90 MG PO TB12: ORAL_TABLET | ORAL | 0 refills | Status: DC

## 2023-11-24 NOTE — Addendum Note (Signed)
 Addended by: Pj Zehner A on: 11/24/2023 11:31 AM   Modules accepted: Orders

## 2023-11-27 ENCOUNTER — Inpatient Hospital Stay: Attending: Hematology & Oncology

## 2023-11-27 ENCOUNTER — Other Ambulatory Visit: Payer: Self-pay

## 2023-11-27 ENCOUNTER — Inpatient Hospital Stay: Admitting: Hematology & Oncology

## 2023-11-27 VITALS — BP 117/54 | HR 59 | Temp 98.0°F | Resp 17 | Ht 66.0 in | Wt 206.0 lb

## 2023-11-27 DIAGNOSIS — Z809 Family history of malignant neoplasm, unspecified: Secondary | ICD-10-CM | POA: Insufficient documentation

## 2023-11-27 DIAGNOSIS — R161 Splenomegaly, not elsewhere classified: Secondary | ICD-10-CM

## 2023-11-27 DIAGNOSIS — F419 Anxiety disorder, unspecified: Secondary | ICD-10-CM | POA: Insufficient documentation

## 2023-11-27 LAB — CMP (CANCER CENTER ONLY)
ALT: 16 U/L (ref 0–44)
AST: 19 U/L (ref 15–41)
Albumin: 4.3 g/dL (ref 3.5–5.0)
Alkaline Phosphatase: 96 U/L (ref 38–126)
Anion gap: 10 (ref 5–15)
BUN: 9 mg/dL (ref 6–20)
CO2: 24 mmol/L (ref 22–32)
Calcium: 8.8 mg/dL — ABNORMAL LOW (ref 8.9–10.3)
Chloride: 105 mmol/L (ref 98–111)
Creatinine: 0.74 mg/dL (ref 0.44–1.00)
GFR, Estimated: >60 mL/min (ref >60)
Glucose, Bld: 91 mg/dL (ref 70–99)
Potassium: 4.8 mmol/L (ref 3.5–5.1)
Sodium: 138 mmol/L (ref 135–145)
Total Bilirubin: 0.4 mg/dL (ref 0.0–1.2)
Total Protein: 7.4 g/dL (ref 6.5–8.1)

## 2023-11-27 LAB — CBC WITH DIFFERENTIAL (CANCER CENTER ONLY)
Abs Immature Granulocytes: 0.01 K/uL (ref 0.00–0.07)
Basophils Absolute: 0 K/uL (ref 0.0–0.1)
Basophils Relative: 1 %
Eosinophils Absolute: 0.1 K/uL (ref 0.0–0.5)
Eosinophils Relative: 2 %
HCT: 37.2 % (ref 36.0–46.0)
Hemoglobin: 12.2 g/dL (ref 12.0–15.0)
Immature Granulocytes: 0 %
Lymphocytes Relative: 31 %
Lymphs Abs: 1.7 K/uL (ref 0.7–4.0)
MCH: 25.8 pg — ABNORMAL LOW (ref 26.0–34.0)
MCHC: 32.8 g/dL (ref 30.0–36.0)
MCV: 78.6 fL — ABNORMAL LOW (ref 80.0–100.0)
Monocytes Absolute: 0.4 K/uL (ref 0.1–1.0)
Monocytes Relative: 8 %
Neutro Abs: 3.2 K/uL (ref 1.7–7.7)
Neutrophils Relative %: 58 %
Platelet Count: 321 K/uL (ref 150–400)
RBC: 4.73 MIL/uL (ref 3.87–5.11)
RDW: 14.6 % (ref 11.5–15.5)
WBC Count: 5.5 K/uL (ref 4.0–10.5)
nRBC: 0 % (ref 0.0–0.2)

## 2023-11-27 LAB — RETICULOCYTES
Immature Retic Fract: 11.4 % (ref 2.3–15.9)
RBC.: 4.69 MIL/uL (ref 3.87–5.11)
Retic Count, Absolute: 73.2 K/uL (ref 19.0–186.0)
Retic Ct Pct: 1.6 % (ref 0.4–3.1)

## 2023-11-27 LAB — SAVE SMEAR(SSMR), FOR PROVIDER SLIDE REVIEW

## 2023-11-27 LAB — LACTATE DEHYDROGENASE: LDH: 192 U/L (ref 98–192)

## 2023-11-27 NOTE — Progress Notes (Signed)
 Referral MD  Reason for Referral: Splenomegaly  Chief Complaint  Patient presents with   New Patient (Initial Visit)  : My spleen is big.  HPI: Misty Simpson is a very charming 43 year old woman.  She is from [place].  She came over as a refugee and 1996.  She was adopted by a family open [place].  She actually works at [employer].  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  She has 1 son.  He is in college.  She had a CAT scan that was done because of abdominal pain.  This was done on 10/06/2023.  This showed that her spleen was enlarged.  It was 14.8 cm.  Somehow, she had a prior CT scan that was done in 2023 which showed her spleen to be 12.7 cm in size.  She had ultrasound of the spleen on 10/23/2023.  This showed a spleen that was 14 x 14.2 x 6.1 cm.  He had a volume of 635 cm.  She was then referred to the [facility] for an evaluation.  She has had no problem with fever.  She had COVID back in 2021.  She does not smoke.  She has very rare alcohol use.  I think she says she stopped back in March..  She has never had surgery.  Her mother died of some kind of cancer.  Her father died of heart disease.  She has had no swollen lymph nodes.  She has had no rashes.  She has had no nausea or vomiting.  She says that she gets full quite quickly when she eats.  She still has her monthly cycles.  I think she has some fibroids..  She has had no change in bowel or bladder habits..  She is up-to-date with her mammogram.  She is up-to-date with her colonoscopy.  She has had no itching.  She has had no obvious bleeding.  She says that her monthly cycles are heavy.  Overall, I would say that her performance status is ECOG 0.    Past Medical History:  Diagnosis Date   Anemia    Anxiety    GERD (gastroesophageal reflux disease)    History of blood transfusion    Kidney stones    Ovarian cyst   :  No past surgical history on file.:   Current Outpatient  Medications:    linaclotide  (LINZESS ) 145 MCG CAPS capsule, Take 1 capsule (145 mcg total) by mouth daily before breakfast., Disp: 90 capsule, Rfl: 1   Multiple Vitamin (MULTIVITAMIN) capsule, Take 1 capsule by mouth daily., Disp: , Rfl:    Naltrexone -buPROPion  HCl ER 8-90 MG TB12, Start 1 tablet every morning for 7 days, then 1 tablet twice daily for 7 days, then 2 tablets every morning and one in the evening for 7 days, then 2 tablets twice a day, Disp: 120 tablet, Rfl: 0   sertraline  (ZOLOFT ) 25 MG tablet, Take 1 tablet (25 mg total) by mouth daily., Disp: 30 tablet, Rfl: 1:  :  No Known Allergies:   Family History  Problem Relation Age of Onset   Cancer Mother        skin   Heart disease Father    Colon cancer Neg Hx    Colon polyps Neg Hx    Esophageal cancer Neg Hx    Rectal cancer Neg Hx    Stomach cancer Neg Hx   :   Social History   Socioeconomic History   Marital status:  Single    Spouse name: Not on file   Number of children: Not on file   Years of education: Not on file   Highest education level: Associate degree: academic program  Occupational History   Not on file  Tobacco Use   Smoking status: Never   Smokeless tobacco: Never  Vaping Use   Vaping status: Never Used  Substance and Sexual Activity   Alcohol use: Not Currently    Comment: social   Drug use: No   Sexual activity: Yes    Birth control/protection: Condom  Other Topics Concern   Not on file  Social History Narrative   Not on file   Social Drivers of Health   Financial Resource Strain: High Risk (10/02/2023)   Overall Financial Resource Strain (CARDIA)    Difficulty of Paying Living Expenses: Hard  Food Insecurity: No Food Insecurity (11/27/2023)   Hunger Vital Sign    Worried About Running Out of Food in the Last Year: Never true    Ran Out of Food in the Last Year: Never true  Transportation Needs: No Transportation Needs (11/27/2023)   PRAPARE - Scientist, Research (physical Sciences) (Medical): No    Lack of Transportation (Non-Medical): No  Physical Activity: Sufficiently Active (10/02/2023)   Exercise Vital Sign    Days of Exercise per Week: 5 days    Minutes of Exercise per Session: 60 min  Stress: Stress Concern Present (10/02/2023)   [employer] of Occupational Health - Occupational Stress Questionnaire    Feeling of Stress: Rather much  Social Connections: Unknown (10/02/2023)   Social Connection and Isolation Panel    Frequency of Communication with Friends and Family: Once a week    Frequency of Social Gatherings with Friends and Family: Patient declined    Attends Religious Services: More than 4 times per year    Active Member of [employer] or Organizations: No    Attends Engineer, Structural: Not on file    Marital Status: Living with partner  Intimate Partner Violence: Not At Risk (11/27/2023)   Humiliation, Afraid, Rape, and Kick questionnaire    Fear of Current or Ex-Partner: No    Emotionally Abused: No    Physically Abused: No    Sexually Abused: No  :  Review of Systems  Constitutional: Negative.   HENT: Negative.    Eyes: Negative.   Respiratory: Negative.    Cardiovascular: Negative.   Gastrointestinal:  Positive for abdominal pain.  Genitourinary: Negative.   Musculoskeletal: Negative.   Skin: Negative.   Neurological: Negative.   Endo/Heme/Allergies: Negative.   Psychiatric/Behavioral: Negative.       Exam:  Vital signs show temperature of 98.  Pulse 59.  Blood pressure 117/54.  Weight is 206 pounds.  @IPVITALS @ Physical Exam Vitals reviewed.  HENT:     Head: Normocephalic and atraumatic.  Eyes:     Pupils: Pupils are equal, round, and reactive to light.  Cardiovascular:     Rate and Rhythm: Normal rate and regular rhythm.     Heart sounds: Normal heart sounds.  Pulmonary:     Effort: Pulmonary effort is normal.     Breath sounds: Normal breath sounds.  Abdominal:     General: Bowel sounds are  normal.     Palpations: Abdomen is soft.  Musculoskeletal:        General: No tenderness or deformity. Normal range of motion.     Cervical back: Normal range of motion.  Lymphadenopathy:  Cervical: No cervical adenopathy.  Skin:    General: Skin is warm and dry.     Findings: No erythema or rash.  Neurological:     Mental Status: She is alert and oriented to person, place, and time.  Psychiatric:        Behavior: Behavior normal.        Thought Content: Thought content normal.        Judgment: Judgment normal.     Recent Labs    11/27/23 1048  WBC 5.5  HGB 12.2  HCT 37.2  PLT 321    Recent Labs    11/27/23 1048  NA 138  K 4.8  CL 105  CO2 24  GLUCOSE 91  BUN 9  CREATININE 0.74  CALCIUM  8.8*    Blood smear review: Normochromic and normocytic palpation of red blood cells.  There are no nucleated red blood cells.  I see no teardrop cells.  There is no schistocytes or spherocytes.  She has no rouleaux formation.  White blood cells.  Normal in morphology and maturation.  There is no immature myeloid or lymphoid cells.  Platelets are adequate in number and size.  Pathology: None    Assessment and Plan: Misty Simpson is a very charming 43 year old [place] woman.  I am I said she is very interesting to talk to..  She has splenomegaly.  Her blood counts certainly do not look bad.  She does not have thrombocytopenia.  She does not have lymphocytosis.  I think you are aware that we may help us  right now would be a bone marrow biopsy.  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  This might be detected in the bone marrow.  I talked to her about this.  I explained why I thought a bone marrow biopsy would be reasonable.  I just do not see any other way that we might be able to help her out right now.  She is somewhat symptomatic because she gets full quite easily.  It is certainly conceivable that the spleen is pressing on her stomach.  The  ultimate diagnosis could be made with a splenectomy.  I just do not think this is needed.  Again, we will see about a bone marrow biopsy.  Will try to get this set up for in about a month or so.  I would like to try to get this done after Thanksgiving.  We will see if she has any kind of hemoglobinopathy.  I do not see anything that looks like hereditary spherocytosis.  It is possible that she that she just may have congestive splenomegaly.  Again she is incredibly charming to talk to.  She has a great attitude.  We will plan to get her back after she has a bone marrow biopsy done.

## 2023-11-30 LAB — HGB FRACTIONATION CASCADE
Hgb A2: 2.2 % (ref 1.8–3.2)
Hgb A: 93.6 % — ABNORMAL LOW (ref 96.4–98.8)
Hgb F: 4.2 % — ABNORMAL HIGH (ref 0.0–2.0)
Hgb S: 0 %

## 2023-12-01 LAB — ANTINUCLEAR ANTIBODIES, IFA: ANA Ab, IFA: NEGATIVE

## 2023-12-02 ENCOUNTER — Other Ambulatory Visit: Payer: Self-pay

## 2023-12-02 DIAGNOSIS — F32A Depression, unspecified: Secondary | ICD-10-CM

## 2023-12-02 MED ORDER — SERTRALINE HCL 25 MG PO TABS: 25.0000 mg | ORAL_TABLET | Freq: Every day | ORAL | 0 refills | Status: DC

## 2023-12-02 NOTE — Telephone Encounter (Signed)
 Requesting: Sertraline  25mg   Last Visit: 07/06/2023  Next Visit: Visit date not found was Due for appt around 11/03/23 can be virtual Last Refill: 10/06/2023  Please Advise

## 2023-12-03 ENCOUNTER — Other Ambulatory Visit: Payer: Self-pay | Admitting: Nurse Practitioner

## 2023-12-03 DIAGNOSIS — F32A Depression, unspecified: Secondary | ICD-10-CM

## 2023-12-04 NOTE — Telephone Encounter (Signed)
 Requesting: Zoloft  25mg  Last Visit: 10/06/2023 Next Visit: Visit date not found Last Refill: 12/02/2023  Please Advise   Pharmacy comment: Your patients prescription plan requires a three month supply. May we adjust the quantity to a three month supply with 3 refills? Your patients prescription plan requires a three month supply. May we adjust the quantity to a three month supply with 3 refills?

## 2023-12-05 ENCOUNTER — Other Ambulatory Visit: Payer: Self-pay | Admitting: Nurse Practitioner

## 2023-12-05 DIAGNOSIS — F32A Depression, unspecified: Secondary | ICD-10-CM

## 2023-12-05 MED ORDER — SERTRALINE HCL 25 MG PO TABS
25.0000 mg | ORAL_TABLET | Freq: Every day | ORAL | 0 refills | Status: DC
Start: 2023-12-05 — End: 2023-12-09

## 2023-12-05 NOTE — Addendum Note (Signed)
 Addended by: GLADIS CLAUDENE GRATE Y on: 12/05/2023 04:55 PM   Modules accepted: Orders

## 2023-12-05 NOTE — Telephone Encounter (Signed)
 Patient is overdue for an appointment. Needs appointment for future refills

## 2023-12-09 ENCOUNTER — Encounter: Payer: Self-pay | Admitting: Nurse Practitioner

## 2023-12-09 ENCOUNTER — Telehealth: Admitting: Nurse Practitioner

## 2023-12-09 VITALS — Ht 66.0 in | Wt 206.0 lb

## 2023-12-09 DIAGNOSIS — Z6833 Body mass index (BMI) 33.0-33.9, adult: Secondary | ICD-10-CM | POA: Diagnosis not present

## 2023-12-09 DIAGNOSIS — E669 Obesity, unspecified: Secondary | ICD-10-CM

## 2023-12-09 DIAGNOSIS — R161 Splenomegaly, not elsewhere classified: Secondary | ICD-10-CM

## 2023-12-09 DIAGNOSIS — F32A Depression, unspecified: Secondary | ICD-10-CM

## 2023-12-09 DIAGNOSIS — F419 Anxiety disorder, unspecified: Secondary | ICD-10-CM | POA: Diagnosis not present

## 2023-12-09 MED ORDER — SERTRALINE HCL 25 MG PO TABS: 25.0000 mg | ORAL_TABLET | Freq: Every day | ORAL | 3 refills | Status: AC

## 2023-12-09 NOTE — Patient Instructions (Signed)
 It was great to see you!  I have refilled your zoloft    Continue following with hematology   Keep up the great work with the weight loss!   Let's follow-up in 2 months, sooner if you have concerns.  If a referral was placed today, you will be contacted for an appointment. Please note that routine referrals can sometimes take up to 3-4 weeks to process. Please call our office if you haven't heard anything after this time frame.  Take care,  Tinnie Harada, NP

## 2023-12-09 NOTE — Assessment & Plan Note (Signed)
 Chronic, stable. Symptoms have improved with zoloft  25mg  daily. Refill sent to the pharmacy.

## 2023-12-09 NOTE — Progress Notes (Signed)
 [facility] [address] [place] [address] Dept: [phone] Dept Fax: [phone]  Virtual Video Visit  I connected with Misty Simpson on 12/09/23 at 11:20 AM EST by a video enabled telemedicine application and verified that I am speaking with the correct person using two identifiers.  Location patient: Home Location provider: Clinic Persons participating in the virtual visit: Patient; Tinnie Harada, NP; Laymon Gladis Sharps, CMA  I discussed the limitations of evaluation and management by telemedicine and the availability of in person appointments. The patient expressed understanding and agreed to proceed.  Chief Complaint  Patient presents with   Medication Management    Rx refill    SUBJECTIVE:  HPI: Misty Simpson is a 43 y.o. female who presents to follow-up on depression and anxiety. She states that the zoloft  is working very well and is not having any side effects. Her symptoms were situationally exacerbated this week, but overall she is liking how the zoloft  works.   She saw hematology for splenomegaly and is planned for bone marrow biopsy on 12/23/23.   She also notes the contrave  is going well. It has boosted her energy and she has lost 5 pounds. She is still watching what she is eating and tries to exercise as able.     12/09/2023   11:14 AM 11/27/2023   11:45 AM 10/23/2023    2:09 PM 10/06/2023    9:54 AM 10/04/2022   10:08 AM  Depression screen PHQ 2/9  Decreased Interest 0 0 1 1 1   Down, Depressed, Hopeless 3 0 1 1 1   PHQ - 2 Score 3 0 2 2 2   Altered sleeping 2  1 2 1   Tired, decreased energy 0  1 1 1   Change in appetite 0  2 0 1  Feeling bad or failure about yourself  0  1 0 1  Trouble concentrating 0  1 1 1   Moving slowly or fidgety/restless 0  0 0 0  Suicidal thoughts 0  0 0 0  PHQ-9 Score 5  8  6  7    Difficult doing work/chores Not difficult at all   Very difficult Somewhat  difficult     Data saved with a previous flowsheet row definition      12/09/2023   11:16 AM 10/06/2023    9:55 AM 10/04/2022   10:08 AM 03/27/2022    9:39 AM  GAD 7 : Generalized Anxiety Score  Nervous, Anxious, on Edge 3 1 2 2   Control/stop worrying 3 1 2 2   Worry too much - different things 3 1 2 2   Trouble relaxing 3 1 2 2   Restless 0 0 1 1  Easily annoyed or irritable 0 1 1 2   Afraid - awful might happen 2 1 2 2   Total GAD 7 Score 14 6 12 13   Anxiety Difficulty Not difficult at all Very difficult Somewhat difficult    Patient Active Problem List   Diagnosis Date Noted   Splenomegaly 12/09/2023   Obesity (BMI 30-39.9) 10/06/2023   Gastroesophageal reflux disease with esophagitis without hemorrhage 10/04/2022   Routine general medical examination at a health care facility 10/04/2022   Chronic constipation 10/04/2022   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 No pertinent surgical history.  Family History  Problem Relation Age of Onset   Cancer Mother  skin   Heart disease Father    Colon cancer Neg Hx    Colon polyps Neg Hx    Esophageal cancer Neg Hx    Rectal cancer Neg Hx    Stomach cancer Neg Hx     Social History   Tobacco Use   Smoking status: Never   Smokeless tobacco: Never  Vaping Use   Vaping status: Never Used  Substance Use Topics   Alcohol use: Not Currently    Comment: social   Drug use: No     Current Outpatient Medications:    linaclotide  (LINZESS ) 145 MCG CAPS capsule, Take 1 capsule (145 mcg total) by mouth daily before breakfast., Disp: 90 capsule, Rfl: 1   Multiple Vitamin (MULTIVITAMIN) capsule, Take 1 capsule by mouth daily., Disp: , Rfl:    Naltrexone -buPROPion  HCl ER 8-90 MG TB12, Start 1 tablet every morning for 7 days, then 1 tablet twice daily for 7 days, then 2 tablets every morning and one in the evening for 7  days, then 2 tablets twice a day, Disp: 120 tablet, Rfl: 0   sertraline  (ZOLOFT ) 25 MG tablet, Take 1 tablet (25 mg total) by mouth daily., Disp: 90 tablet, Rfl: 3  No Known Allergies  ROS: See pertinent positives and negatives per HPI.  OBSERVATIONS/OBJECTIVE:  VITALS per patient if applicable: Today's Vitals   12/09/23 1113  Weight: 206 lb (93.4 kg)  Height: 5' 6 (1.676 m)   Body mass index is 33.25 kg/m.    GENERAL: Alert and oriented. Appears well and in no acute distress.  HEENT: Atraumatic. Conjunctiva clear. No obvious abnormalities on inspection of external nose and ears.  NECK: Normal movements of the head and neck.  LUNGS: On inspection, no signs of respiratory distress. Breathing rate appears normal. No obvious gross SOB, gasping or wheezing, and no conversational dyspnea.  CV: No obvious cyanosis.  MS: Moves all visible extremities without noticeable abnormality.  PSYCH/NEURO: Pleasant and cooperative. No obvious depression or anxiety. Speech and thought processing grossly intact.  ASSESSMENT AND PLAN:  Problem List Items Addressed This Visit       Other   Anxiety and depression   Chronic, stable. Symptoms have improved with zoloft  25mg  daily. Refill sent to the pharmacy.       Relevant Medications   sertraline  (ZOLOFT ) 25 MG tablet   Obesity (BMI 30-39.9) - Primary   BMI 33.2. She has lost 5 pounds since her last visit. Congratulated her on this! Continue focus on nutrition and exercise. She should stop the contrave  3 days prior to bone marrow biopsy, but she can reach out to hematology and see if it needs to be held longer than this.       Splenomegaly   She is following with hematology and plans for bone marrow biopsy.         I discussed the assessment and treatment plan with the patient. The patient was provided an opportunity to ask questions and all were answered. The patient agreed with the plan and demonstrated an understanding of the  instructions.   The patient was advised to call back or seek an in-person evaluation if the symptoms worsen or if the condition fails to improve as anticipated.   Tinnie DELENA Harada, NP

## 2023-12-09 NOTE — Assessment & Plan Note (Signed)
 She is following with hematology and plans for bone marrow biopsy.

## 2023-12-09 NOTE — Assessment & Plan Note (Signed)
 BMI 33.2. She has lost 5 pounds since her last visit. Congratulated her on this! Continue focus on nutrition and exercise. She should stop the contrave  3 days prior to bone marrow biopsy, but she can reach out to hematology and see if it needs to be held longer than this.

## 2023-12-19 DIAGNOSIS — N852 Hypertrophy of uterus: Secondary | ICD-10-CM | POA: Diagnosis not present

## 2023-12-19 DIAGNOSIS — R102 Pelvic and perineal pain unspecified side: Secondary | ICD-10-CM | POA: Diagnosis not present

## 2023-12-19 DIAGNOSIS — R161 Splenomegaly, not elsewhere classified: Secondary | ICD-10-CM | POA: Diagnosis not present

## 2023-12-19 DIAGNOSIS — R1031 Right lower quadrant pain: Secondary | ICD-10-CM | POA: Diagnosis not present

## 2023-12-19 DIAGNOSIS — D259 Leiomyoma of uterus, unspecified: Secondary | ICD-10-CM | POA: Diagnosis not present

## 2023-12-22 ENCOUNTER — Other Ambulatory Visit: Payer: Self-pay | Admitting: Interventional Radiology

## 2023-12-22 DIAGNOSIS — Z01818 Encounter for other preprocedural examination: Secondary | ICD-10-CM

## 2023-12-22 NOTE — H&P (Signed)
 Chief Complaint: Splenomegaly; referred for image guided bone marrow biopsy to rule out splenic lymphoma  Referring Provider(s): Ennever,P  Supervising Physician: Jennefer Rover  Patient Status: [facility] - Out-pt  History of Present Illness: Misty Simpson is a 43 y.o. female with PMH sig for anemia, anxiety, GERD, nephrolithiasis, ovarian cyst who presents now with hx abdominal pain/splenomegaly. She is scheduled today for image guided bone marrow biopsy to rule out splenic lymphoma with villous lymphocytes.   *** Patient is Full Code  Past Medical History:  Diagnosis Date   Anemia    Anxiety    GERD (gastroesophageal reflux disease)    History of blood transfusion    Kidney stones    Ovarian cyst     No past surgical history on file.  Allergies: Patient has no known allergies.  Medications: Prior to Admission medications   Medication Sig Start Date End Date Taking? Authorizing Provider  linaclotide  (LINZESS ) 145 MCG CAPS capsule Take 1 capsule (145 mcg total) by mouth daily before breakfast. 10/06/23   McElwee, Lauren A, NP  Multiple Vitamin (MULTIVITAMIN) capsule Take 1 capsule by mouth daily.    [provider]  Naltrexone -buPROPion  HCl ER 8-90 MG TB12 Start 1 tablet every morning for 7 days, then 1 tablet twice daily for 7 days, then 2 tablets every morning and one in the evening for 7 days, then 2 tablets twice a day 11/24/23   McElwee, Lauren A, NP  sertraline  (ZOLOFT ) 25 MG tablet Take 1 tablet (25 mg total) by mouth daily. 12/09/23   McElwee, Tinnie LABOR, NP     Family History  Problem Relation Age of Onset   Cancer Mother        skin   Heart disease Father    Colon cancer Neg Hx    Colon polyps Neg Hx    Esophageal cancer Neg Hx    Rectal cancer Neg Hx    Stomach cancer Neg Hx     Social History   Socioeconomic History   Marital status: Single    Spouse name: Not on file   Number of children: Not on file   Years of education: Not on file    Highest education level: Associate degree: academic program  Occupational History   Not on file  Tobacco Use   Smoking status: Never   Smokeless tobacco: Never  Vaping Use   Vaping status: Never Used  Substance and Sexual Activity   Alcohol use: Not Currently    Comment: social   Drug use: No   Sexual activity: Yes    Birth control/protection: Condom  Other Topics Concern   Not on file  Social History Narrative   Not on file   Social Drivers of Health   Financial Resource Strain: High Risk (10/02/2023)   Overall Financial Resource Strain (CARDIA)    Difficulty of Paying Living Expenses: Hard  Food Insecurity: No Food Insecurity (11/27/2023)   Hunger Vital Sign    Worried About Running Out of Food in the Last Year: Never true    Ran Out of Food in the Last Year: Never true  Transportation Needs: No Transportation Needs (11/27/2023)   PRAPARE - Administrator, Civil Service (Medical): No    Lack of Transportation (Non-Medical): No  Physical Activity: Sufficiently Active (10/02/2023)   Exercise Vital Sign    Days of Exercise per Week: 5 days    Minutes of Exercise per Session: 60 min  Stress: Stress Concern Present (10/02/2023)  [employer] of Occupational Health - Occupational Stress Questionnaire    Feeling of Stress: Rather much  Social Connections: Unknown (10/02/2023)   Social Connection and Isolation Panel    Frequency of Communication with Friends and Family: Once a week    Frequency of Social Gatherings with Friends and Family: Patient declined    Attends Religious Services: More than 4 times per year    Active Member of [employer] or Organizations: No    Attends Banker Meetings: Not on file    Marital Status: Living with partner       Review of Systems  Vital Signs: LMP 11/21/2023 (Approximate)   Advance Care Plan: no documents on file   Physical Exam  Imaging: No results found.  Labs:  CBC: Recent Labs    10/06/23 1030  11/27/23 1048  WBC 6.2 5.5  HGB 11.6* 12.2  HCT 35.0* 37.2  PLT 307.0 321    COAGS: No results for input(s): INR, APTT in the last 8760 hours.  BMP: Recent Labs    10/06/23 1030 11/27/23 1048  NA 137 138  K 3.6 4.8  CL 104 105  CO2 27 24  GLUCOSE 90 91  BUN 10 9  CALCIUM  8.7 8.8*  CREATININE 0.66 0.74  GFRNONAA  --  >60    LIVER FUNCTION TESTS: Recent Labs    10/06/23 1030 11/27/23 1048  BILITOT 0.4 0.4  AST 14 19  ALT 15 16  ALKPHOS 73 96  PROT 7.1 7.4  ALBUMIN 4.0 4.3    TUMOR MARKERS: No results for input(s): AFPTM, CEA, CA199, CHROMGRNA in the last 8760 hours.  Assessment and Plan: 43 y.o. female with PMH sig for anemia, anxiety, GERD, nephrolithiasis, ovarian cyst who presents now with hx abdominal pain/splenomegaly. She is scheduled today for image guided bone marrow biopsy to rule out splenic lymphoma with villous lymphocytes. Risks and benefits of procedure was discussed with the patient  including, but not limited to bleeding, infection, damage to adjacent structures or low yield requiring additional tests.  All of the questions were answered and there is agreement to proceed.  Consent signed and in chart.    Thank you for allowing our service to participate in Misty Simpson 's care.  Electronically Signed: D. Franky Rakers, PA-C   12/22/2023, 2:04 PM      I spent a total of  20 minutes   in face to face in clinical consultation, greater than 50% of which was counseling/coordinating care for image guided bone marrow biopsy

## 2023-12-23 ENCOUNTER — Ambulatory Visit (HOSPITAL_COMMUNITY)
Admission: RE | Admit: 2023-12-23 | Discharge: 2023-12-23 | Disposition: A | Discharge status: Home / Self Care | Source: Ambulatory Visit | Attending: Hematology & Oncology | Admitting: Hematology & Oncology

## 2023-12-23 ENCOUNTER — Other Ambulatory Visit: Payer: Self-pay

## 2023-12-23 DIAGNOSIS — R161 Splenomegaly, not elsewhere classified: Secondary | ICD-10-CM | POA: Diagnosis not present

## 2023-12-23 DIAGNOSIS — D72820 Lymphocytosis (symptomatic): Secondary | ICD-10-CM | POA: Diagnosis not present

## 2023-12-23 DIAGNOSIS — D509 Iron deficiency anemia, unspecified: Secondary | ICD-10-CM | POA: Insufficient documentation

## 2023-12-23 DIAGNOSIS — Z01818 Encounter for other preprocedural examination: Secondary | ICD-10-CM

## 2023-12-23 LAB — CBC WITH DIFFERENTIAL/PLATELET
Abs Immature Granulocytes: 0.01 K/uL (ref 0.00–0.07)
Basophils Absolute: 0 K/uL (ref 0.0–0.1)
Basophils Relative: 1 %
Eosinophils Absolute: 0.1 K/uL (ref 0.0–0.5)
Eosinophils Relative: 2 %
HCT: 36.5 % (ref 36.0–46.0)
Hemoglobin: 11.9 g/dL — ABNORMAL LOW (ref 12.0–15.0)
Immature Granulocytes: 0 %
Lymphocytes Relative: 27 %
Lymphs Abs: 1.4 K/uL (ref 0.7–4.0)
MCH: 25.9 pg — ABNORMAL LOW (ref 26.0–34.0)
MCHC: 32.6 g/dL (ref 30.0–36.0)
MCV: 79.5 fL — ABNORMAL LOW (ref 80.0–100.0)
Monocytes Absolute: 0.5 K/uL (ref 0.1–1.0)
Monocytes Relative: 9 %
Neutro Abs: 3.3 K/uL (ref 1.7–7.7)
Neutrophils Relative %: 61 %
Platelets: 276 K/uL (ref 150–400)
RBC: 4.59 MIL/uL (ref 3.87–5.11)
RDW: 14.6 % (ref 11.5–15.5)
WBC: 5.3 K/uL (ref 4.0–10.5)
nRBC: 0 % (ref 0.0–0.2)

## 2023-12-23 MED ORDER — SODIUM CHLORIDE 0.9 % IV SOLN
INTRAVENOUS | Status: DC
  Administered 2023-12-23: 20 mL/h via INTRAVENOUS

## 2023-12-23 MED ORDER — MIDAZOLAM HCL (PF) 2 MG/2ML IJ SOLN
INTRAMUSCULAR | Status: AC | PRN
  Administered 2023-12-23: .5 mg via INTRAVENOUS
  Administered 2023-12-23: 1 mg via INTRAVENOUS
  Administered 2023-12-23: .5 mg via INTRAVENOUS

## 2023-12-23 MED ORDER — FENTANYL CITRATE (PF) 100 MCG/2ML IJ SOLN
INTRAMUSCULAR | Status: AC | PRN
  Administered 2023-12-23: 25 ug via INTRAVENOUS
  Administered 2023-12-23: 50 ug via INTRAVENOUS

## 2023-12-23 MED ORDER — FENTANYL CITRATE (PF) 100 MCG/2ML IJ SOLN
INTRAMUSCULAR | Status: AC
  Filled 2023-12-23: qty 2

## 2023-12-23 MED ORDER — MIDAZOLAM HCL 2 MG/2ML IJ SOLN
INTRAMUSCULAR | Status: AC
  Filled 2023-12-23: qty 2

## 2023-12-23 NOTE — Procedures (Signed)
Interventional Radiology Procedure Note  Procedure: CT guided aspirate and core biopsy of right iliac bone  Complications: None  Recommendations: - Bedrest supine x 1 hrs - Hydrocodone PRN  Pain - Follow biopsy results   Arel Tippen, MD   

## 2023-12-25 ENCOUNTER — Ambulatory Visit: Payer: Self-pay | Admitting: Hematology & Oncology

## 2023-12-25 LAB — SURGICAL PATHOLOGY

## 2023-12-26 ENCOUNTER — Encounter: Payer: Self-pay | Admitting: *Deleted

## 2023-12-30 LAB — SURGICAL PATHOLOGY

## 2023-12-31 ENCOUNTER — Encounter (HOSPITAL_COMMUNITY): Payer: Self-pay

## 2024-01-04 DIAGNOSIS — H1089 Other conjunctivitis: Secondary | ICD-10-CM | POA: Diagnosis not present

## 2024-01-09 ENCOUNTER — Other Ambulatory Visit: Payer: Self-pay | Admitting: Nurse Practitioner

## 2024-01-09 NOTE — Telephone Encounter (Signed)
 Requesting: CONTRAVE  ER 8-90 MG TABLET  Last Visit: 10/06/2023 Next Visit: 02/09/2024 Last Refill: 11/24/2023  Please Advise

## 2024-02-06 NOTE — Assessment & Plan Note (Signed)
 BMI 33.2. She has lost 5 pounds since her last visit. Congratulated her on this! Continue focus on nutrition and exercise.

## 2024-02-06 NOTE — Progress Notes (Incomplete)
" ° °  Established Visit  There were no vitals taken for this visit.   Subjective:    Patient ID: Misty Simpson, female    DOB: January 13, 1981, 44 y.o.   MRN: [id number]  CC: No chief complaint on file.   HPI: Misty Simpson is a 44 y.o. female presents for a follow up on depression and anxiety. She has been managed on sertraline  25 mg daily.  She is on Contrave  as well. It has reportedly boosted her energy and she has lost 5 pounds. She is still watching what she is eating and tries to exercise as able.    Past Medical History:  Diagnosis Date   Anemia    Anxiety    GERD (gastroesophageal reflux disease)    History of blood transfusion    Kidney stones    Ovarian cyst     No past surgical history on file.  Family History  Problem Relation Age of Onset   Cancer Mother        skin   Heart disease Father    Colon cancer Neg Hx    Colon polyps Neg Hx    Esophageal cancer Neg Hx    Rectal cancer Neg Hx    Stomach cancer Neg Hx      Social History[1]  Medications Ordered Prior to Encounter[2]   Review of Systems      Objective:    There were no vitals taken for this visit.  Wt Readings from Last 3 Encounters:  12/23/23 206 lb (93.4 kg)  12/09/23 206 lb (93.4 kg)  11/27/23 206 lb (93.4 kg)    BP Readings from Last 3 Encounters:  12/23/23 122/74  11/27/23 (!) 117/54  10/23/23 132/83    Physical Exam     Assessment & Plan:   Problem List Items Addressed This Visit   None    Follow up plan: No follow-ups on file.  Tinnie DELENA Harada, NP  I,Emily Lagle,acting as a scribe for [employer], NP.,have documented all relevant documentation on the behalf of Lauren DELENA Harada, NP.  I, Tinnie DELENA Harada, NP, have reviewed all documentation for this visit. The documentation on 02/09/2024 for the exam, diagnosis, procedures, and orders are all accurate and complete.    [1]  Social History Tobacco Use   Smoking status: Never   Smokeless tobacco:  Never  Vaping Use   Vaping status: Never Used  Substance Use Topics   Alcohol use: Not Currently    Comment: social   Drug use: No  [2]  Current Outpatient Medications on File Prior to Visit  Medication Sig Dispense Refill   CONTRAVE  8-90 MG TB12 Take 1 tablet by mouth daily for 7 days then 1 twice a day for 7 days, then 2 in the am and 1 in the pm for 7 days then take 2 twice daily thereafter. 120 tablet 0   linaclotide  (LINZESS ) 145 MCG CAPS capsule Take 1 capsule (145 mcg total) by mouth daily before breakfast. 90 capsule 1   Multiple Vitamin (MULTIVITAMIN) capsule Take 1 capsule by mouth daily.     sertraline  (ZOLOFT ) 25 MG tablet Take 1 tablet (25 mg total) by mouth daily. 90 tablet 3   No current facility-administered medications on file prior to visit.   "

## 2024-02-06 NOTE — Assessment & Plan Note (Signed)
 Chronic, stable. Symptoms have improved with zoloft  25mg  daily. Refill sent to the pharmacy.

## 2024-02-09 ENCOUNTER — Ambulatory Visit: Admitting: Nurse Practitioner

## 2024-02-09 DIAGNOSIS — F419 Anxiety disorder, unspecified: Secondary | ICD-10-CM

## 2024-02-09 DIAGNOSIS — E669 Obesity, unspecified: Secondary | ICD-10-CM

## 2024-02-09 NOTE — Progress Notes (Signed)
" ° °  Established Visit  There were no vitals taken for this visit.   Subjective:    Patient ID: Misty Simpson, female    DOB: 28-Jan-1980, 44 y.o.   MRN: [id number]  CC: No chief complaint on file.   HPI: Misty Simpson is a 44 y.o. female presents for a follow up on depression and anxiety. She has been managed on sertraline  25 mg daily.  She is on Contrave  as well. It has reportedly boosted her energy and she has lost 5 pounds. She is still watching what she is eating and tries to exercise as able.    Past Medical History:  Diagnosis Date   Anemia    Anxiety    GERD (gastroesophageal reflux disease)    History of blood transfusion    Kidney stones    Ovarian cyst     No past surgical history on file.  Family History  Problem Relation Age of Onset   Cancer Mother        skin   Heart disease Father    Colon cancer Neg Hx    Colon polyps Neg Hx    Esophageal cancer Neg Hx    Rectal cancer Neg Hx    Stomach cancer Neg Hx      Social History[1]  Medications Ordered Prior to Encounter[2]   Review of Systems      Objective:    There were no vitals taken for this visit.  Wt Readings from Last 3 Encounters:  12/23/23 206 lb (93.4 kg)  12/09/23 206 lb (93.4 kg)  11/27/23 206 lb (93.4 kg)    BP Readings from Last 3 Encounters:  12/23/23 122/74  11/27/23 (!) 117/54  10/23/23 132/83    Physical Exam     Assessment & Plan:   Problem List Items Addressed This Visit   None    Follow up plan: No follow-ups on file.  Tinnie DELENA Harada, NP  I,Emily Lagle,acting as a scribe for [employer], NP.,have documented all relevant documentation on the behalf of Leida L[place]on DELENA Harada, NP.  I, Tinnie DELENA Harada, NP, have reviewed all documentation for this visit. The documentation on 02/12/2024 for the exam, diagnosis, procedures, and orders are all accurate and complete.     [1]  Social History Tobacco Use   Smoking status: Never   Smokeless tobacco:  Never  Vaping Use   Vaping status: Never Used  Substance Use Topics   Alcohol use: Not Currently    Comment: social   Drug use: No  [2]  Current Outpatient Medications on File Prior to Visit  Medication Sig Dispense Refill   CONTRAVE  8-90 MG TB12 Take 1 tablet by mouth daily for 7 days then 1 twice a day for 7 days, then 2 in the am and 1 in the pm for 7 days then take 2 twice daily thereafter. 120 tablet 0   linaclotide  (LINZESS ) 145 MCG CAPS capsule Take 1 capsule (145 mcg total) by mouth daily before breakfast. 90 capsule 1   Multiple Vitamin (MULTIVITAMIN) capsule Take 1 capsule by mouth daily.     sertraline  (ZOLOFT ) 25 MG tablet Take 1 tablet (25 mg total) by mouth daily. 90 tablet 3   No current facility-administered medications on file prior to visit.   "

## 2024-02-12 ENCOUNTER — Ambulatory Visit: Admitting: Nurse Practitioner

## 2024-02-12 ENCOUNTER — Encounter: Payer: Self-pay | Admitting: Nurse Practitioner

## 2024-02-12 VITALS — BP 136/74 | HR 74 | Temp 97.1°F | Ht 66.0 in | Wt 201.8 lb

## 2024-02-12 DIAGNOSIS — E669 Obesity, unspecified: Secondary | ICD-10-CM

## 2024-02-12 DIAGNOSIS — F32A Depression, unspecified: Secondary | ICD-10-CM | POA: Diagnosis not present

## 2024-02-12 DIAGNOSIS — R161 Splenomegaly, not elsewhere classified: Secondary | ICD-10-CM

## 2024-02-12 DIAGNOSIS — F419 Anxiety disorder, unspecified: Secondary | ICD-10-CM | POA: Diagnosis not present

## 2024-02-12 DIAGNOSIS — K5909 Other constipation: Secondary | ICD-10-CM | POA: Diagnosis not present

## 2024-02-12 MED ORDER — LINACLOTIDE 145 MCG PO CAPS: 145.0000 ug | ORAL_CAPSULE | Freq: Every day | ORAL | 1 refills | Status: AC

## 2024-02-12 NOTE — Assessment & Plan Note (Addendum)
 Chronic, stable. Symptoms have improved with zoloft  25mg  daily. Continue current regimen.

## 2024-02-12 NOTE — Assessment & Plan Note (Signed)
 She saw hematology/oncology who ordered a bone marrow biopsy which was negative. Reassured patient that this is good news.

## 2024-02-12 NOTE — Assessment & Plan Note (Addendum)
 BMI 32.5. Management with Contrave  resulted in a 5-pound weight loss. Alcohol intake has decreased and physical activity has increased. Her BMR is 1728 calories/day, with a recommended intake of 1500-1600 calories/day. Previous stomach pain was likely due to not taking Contrave  with food. Continue Contrave  with food, maintain the exercise regimen of walking 4-5 days/week, and continue dietary modifications focusing on protein intake while reducing caloric intake to 1500-1600 calories/day. Follow-up in 3 months.

## 2024-02-12 NOTE — Patient Instructions (Signed)
It was great to see you!  Keep up the great work!   Let's follow-up in 3 months, sooner if you have concerns.  If a referral was placed today, you will be contacted for an appointment. Please note that routine referrals can sometimes take up to 3-4 weeks to process. Please call our office if you haven't heard anything after this time frame.  Take care,  Gordana Kewley, NP  

## 2024-02-12 NOTE — Assessment & Plan Note (Signed)
 This is effectively managed with Linzess . Continue Linzess  145 mcg daily

## 2024-05-12 ENCOUNTER — Ambulatory Visit: Admitting: Nurse Practitioner
